# Patient Record
Sex: Male | Born: 1950
Health system: Southern US, Community
[De-identification: ages and names within clinical notes are randomized; demographics above are authoritative.]

## PROBLEM LIST (undated history)

## (undated) DIAGNOSIS — I700 Atherosclerosis of aorta: Secondary | ICD-10-CM

## (undated) DIAGNOSIS — I10 Essential (primary) hypertension: Secondary | ICD-10-CM

## (undated) DIAGNOSIS — N400 Enlarged prostate without lower urinary tract symptoms: Secondary | ICD-10-CM

## (undated) DIAGNOSIS — K635 Polyp of colon: Secondary | ICD-10-CM

## (undated) DIAGNOSIS — E7800 Pure hypercholesterolemia, unspecified: Secondary | ICD-10-CM

## (undated) DIAGNOSIS — I2510 Atherosclerotic heart disease of native coronary artery without angina pectoris: Secondary | ICD-10-CM

## (undated) DIAGNOSIS — M1990 Unspecified osteoarthritis, unspecified site: Secondary | ICD-10-CM

## (undated) HISTORY — PX: VASECTOMY: SHX75

## (undated) HISTORY — DX: Polyp of colon: K63.5

## (undated) HISTORY — DX: Pure hypercholesterolemia, unspecified: E78.00

## (undated) HISTORY — DX: Benign prostatic hyperplasia without lower urinary tract symptoms: N40.0

## (undated) HISTORY — DX: Atherosclerotic heart disease of native coronary artery without angina pectoris: I25.10

## (undated) HISTORY — DX: Atherosclerosis of aorta: I70.0

## (undated) HISTORY — DX: Essential (primary) hypertension: I10

## (undated) HISTORY — DX: Unspecified osteoarthritis, unspecified site: M19.90

## (undated) HISTORY — PX: TONSILLECTOMY: SUR1361

---

## 2016-02-02 ENCOUNTER — Other Ambulatory Visit: Payer: Self-pay | Admitting: Unknown Physician Specialty

## 2016-02-02 DIAGNOSIS — Z1211 Encounter for screening for malignant neoplasm of colon: Secondary | ICD-10-CM

## 2016-02-08 ENCOUNTER — Other Ambulatory Visit: Payer: Self-pay | Admitting: Unknown Physician Specialty

## 2016-02-08 DIAGNOSIS — Z1211 Encounter for screening for malignant neoplasm of colon: Secondary | ICD-10-CM

## 2016-02-21 ENCOUNTER — Ambulatory Visit
Admission: RE | Admit: 2016-02-21 | Discharge: 2016-02-21 | Disposition: A | Discharge status: Home / Self Care | Payer: BLUE CROSS/BLUE SHIELD | Source: Ambulatory Visit | Attending: Unknown Physician Specialty | Admitting: Unknown Physician Specialty

## 2016-02-21 ENCOUNTER — Inpatient Hospital Stay: Admission: RE | Admit: 2016-02-21 | Payer: Self-pay | Source: Ambulatory Visit

## 2016-02-21 DIAGNOSIS — Z1211 Encounter for screening for malignant neoplasm of colon: Secondary | ICD-10-CM

## 2017-12-24 DIAGNOSIS — L570 Actinic keratosis: Secondary | ICD-10-CM | POA: Diagnosis not present

## 2017-12-24 DIAGNOSIS — L820 Inflamed seborrheic keratosis: Secondary | ICD-10-CM | POA: Diagnosis not present

## 2017-12-24 DIAGNOSIS — L918 Other hypertrophic disorders of the skin: Secondary | ICD-10-CM | POA: Diagnosis not present

## 2017-12-24 DIAGNOSIS — D1801 Hemangioma of skin and subcutaneous tissue: Secondary | ICD-10-CM | POA: Diagnosis not present

## 2018-01-17 DIAGNOSIS — I10 Essential (primary) hypertension: Secondary | ICD-10-CM | POA: Diagnosis not present

## 2018-01-17 DIAGNOSIS — Z23 Encounter for immunization: Secondary | ICD-10-CM | POA: Diagnosis not present

## 2018-09-30 DIAGNOSIS — N400 Enlarged prostate without lower urinary tract symptoms: Secondary | ICD-10-CM | POA: Diagnosis not present

## 2018-09-30 DIAGNOSIS — Z0000 Encounter for general adult medical examination without abnormal findings: Secondary | ICD-10-CM | POA: Diagnosis not present

## 2018-09-30 DIAGNOSIS — M8949 Other hypertrophic osteoarthropathy, multiple sites: Secondary | ICD-10-CM | POA: Diagnosis not present

## 2018-09-30 DIAGNOSIS — I10 Essential (primary) hypertension: Secondary | ICD-10-CM | POA: Diagnosis not present

## 2018-09-30 DIAGNOSIS — Z1389 Encounter for screening for other disorder: Secondary | ICD-10-CM | POA: Diagnosis not present

## 2018-09-30 DIAGNOSIS — Z6835 Body mass index (BMI) 35.0-35.9, adult: Secondary | ICD-10-CM | POA: Diagnosis not present

## 2019-04-01 DIAGNOSIS — I10 Essential (primary) hypertension: Secondary | ICD-10-CM | POA: Diagnosis not present

## 2019-06-09 DIAGNOSIS — R0789 Other chest pain: Secondary | ICD-10-CM | POA: Diagnosis not present

## 2019-06-09 DIAGNOSIS — M25512 Pain in left shoulder: Secondary | ICD-10-CM | POA: Diagnosis not present

## 2019-06-10 DIAGNOSIS — R0789 Other chest pain: Secondary | ICD-10-CM | POA: Diagnosis not present

## 2019-06-10 DIAGNOSIS — S299XXA Unspecified injury of thorax, initial encounter: Secondary | ICD-10-CM | POA: Diagnosis not present

## 2019-06-10 DIAGNOSIS — M25512 Pain in left shoulder: Secondary | ICD-10-CM | POA: Diagnosis not present

## 2019-12-29 DIAGNOSIS — L820 Inflamed seborrheic keratosis: Secondary | ICD-10-CM | POA: Diagnosis not present

## 2019-12-29 DIAGNOSIS — L739 Follicular disorder, unspecified: Secondary | ICD-10-CM | POA: Diagnosis not present

## 2020-03-18 DIAGNOSIS — J101 Influenza due to other identified influenza virus with other respiratory manifestations: Secondary | ICD-10-CM | POA: Diagnosis not present

## 2020-06-03 DIAGNOSIS — J329 Chronic sinusitis, unspecified: Secondary | ICD-10-CM | POA: Diagnosis not present

## 2020-07-21 DIAGNOSIS — Z0000 Encounter for general adult medical examination without abnormal findings: Secondary | ICD-10-CM | POA: Diagnosis not present

## 2020-07-21 DIAGNOSIS — Z1331 Encounter for screening for depression: Secondary | ICD-10-CM | POA: Diagnosis not present

## 2020-07-21 DIAGNOSIS — I10 Essential (primary) hypertension: Secondary | ICD-10-CM | POA: Diagnosis not present

## 2020-07-21 DIAGNOSIS — M159 Polyosteoarthritis, unspecified: Secondary | ICD-10-CM | POA: Diagnosis not present

## 2020-07-21 DIAGNOSIS — Z6837 Body mass index (BMI) 37.0-37.9, adult: Secondary | ICD-10-CM | POA: Diagnosis not present

## 2020-07-21 DIAGNOSIS — Z1339 Encounter for screening examination for other mental health and behavioral disorders: Secondary | ICD-10-CM | POA: Diagnosis not present

## 2020-07-21 DIAGNOSIS — N400 Enlarged prostate without lower urinary tract symptoms: Secondary | ICD-10-CM | POA: Diagnosis not present

## 2020-07-26 ENCOUNTER — Other Ambulatory Visit: Payer: Self-pay | Admitting: Internal Medicine

## 2020-07-26 DIAGNOSIS — Z1211 Encounter for screening for malignant neoplasm of colon: Secondary | ICD-10-CM

## 2020-08-15 ENCOUNTER — Ambulatory Visit
Admission: RE | Admit: 2020-08-15 | Discharge: 2020-08-15 | Disposition: A | Discharge status: Home / Self Care | Payer: PPO | Source: Ambulatory Visit | Attending: Internal Medicine | Admitting: Internal Medicine

## 2020-08-15 DIAGNOSIS — Z1211 Encounter for screening for malignant neoplasm of colon: Secondary | ICD-10-CM

## 2020-08-15 DIAGNOSIS — K5730 Diverticulosis of large intestine without perforation or abscess without bleeding: Secondary | ICD-10-CM | POA: Diagnosis not present

## 2020-08-23 DIAGNOSIS — Z79899 Other long term (current) drug therapy: Secondary | ICD-10-CM | POA: Diagnosis not present

## 2020-08-23 DIAGNOSIS — Z01818 Encounter for other preprocedural examination: Secondary | ICD-10-CM | POA: Diagnosis not present

## 2020-08-23 DIAGNOSIS — M79609 Pain in unspecified limb: Secondary | ICD-10-CM | POA: Diagnosis not present

## 2020-08-23 DIAGNOSIS — M1711 Unilateral primary osteoarthritis, right knee: Secondary | ICD-10-CM | POA: Diagnosis not present

## 2020-08-23 DIAGNOSIS — E559 Vitamin D deficiency, unspecified: Secondary | ICD-10-CM | POA: Diagnosis not present

## 2020-09-02 DIAGNOSIS — I10 Essential (primary) hypertension: Secondary | ICD-10-CM | POA: Diagnosis not present

## 2020-09-02 DIAGNOSIS — M1711 Unilateral primary osteoarthritis, right knee: Secondary | ICD-10-CM | POA: Diagnosis not present

## 2020-09-22 DIAGNOSIS — Z01818 Encounter for other preprocedural examination: Secondary | ICD-10-CM | POA: Diagnosis not present

## 2020-09-22 DIAGNOSIS — M1711 Unilateral primary osteoarthritis, right knee: Secondary | ICD-10-CM | POA: Diagnosis not present

## 2020-09-28 DIAGNOSIS — Z9889 Other specified postprocedural states: Secondary | ICD-10-CM | POA: Diagnosis not present

## 2020-09-28 DIAGNOSIS — I10 Essential (primary) hypertension: Secondary | ICD-10-CM | POA: Diagnosis not present

## 2020-09-28 DIAGNOSIS — Z7982 Long term (current) use of aspirin: Secondary | ICD-10-CM | POA: Diagnosis not present

## 2020-09-28 DIAGNOSIS — G8918 Other acute postprocedural pain: Secondary | ICD-10-CM | POA: Diagnosis not present

## 2020-09-28 DIAGNOSIS — Z79899 Other long term (current) drug therapy: Secondary | ICD-10-CM | POA: Diagnosis not present

## 2020-09-28 DIAGNOSIS — Z471 Aftercare following joint replacement surgery: Secondary | ICD-10-CM | POA: Diagnosis not present

## 2020-09-28 DIAGNOSIS — M1711 Unilateral primary osteoarthritis, right knee: Secondary | ICD-10-CM | POA: Diagnosis not present

## 2020-09-28 DIAGNOSIS — Z96651 Presence of right artificial knee joint: Secondary | ICD-10-CM | POA: Diagnosis not present

## 2020-09-30 DIAGNOSIS — Z471 Aftercare following joint replacement surgery: Secondary | ICD-10-CM | POA: Diagnosis not present

## 2020-09-30 DIAGNOSIS — K5790 Diverticulosis of intestine, part unspecified, without perforation or abscess without bleeding: Secondary | ICD-10-CM | POA: Diagnosis not present

## 2020-09-30 DIAGNOSIS — I10 Essential (primary) hypertension: Secondary | ICD-10-CM | POA: Diagnosis not present

## 2020-09-30 DIAGNOSIS — Z96651 Presence of right artificial knee joint: Secondary | ICD-10-CM | POA: Diagnosis not present

## 2020-09-30 DIAGNOSIS — Z86010 Personal history of colonic polyps: Secondary | ICD-10-CM | POA: Diagnosis not present

## 2020-10-11 DIAGNOSIS — Z96651 Presence of right artificial knee joint: Secondary | ICD-10-CM | POA: Diagnosis not present

## 2020-10-14 DIAGNOSIS — M25561 Pain in right knee: Secondary | ICD-10-CM | POA: Diagnosis not present

## 2020-10-14 DIAGNOSIS — M6281 Muscle weakness (generalized): Secondary | ICD-10-CM | POA: Diagnosis not present

## 2020-10-14 DIAGNOSIS — M25661 Stiffness of right knee, not elsewhere classified: Secondary | ICD-10-CM | POA: Diagnosis not present

## 2020-10-19 DIAGNOSIS — M25661 Stiffness of right knee, not elsewhere classified: Secondary | ICD-10-CM | POA: Diagnosis not present

## 2020-10-19 DIAGNOSIS — M25561 Pain in right knee: Secondary | ICD-10-CM | POA: Diagnosis not present

## 2020-10-19 DIAGNOSIS — M6281 Muscle weakness (generalized): Secondary | ICD-10-CM | POA: Diagnosis not present

## 2020-10-21 DIAGNOSIS — M6281 Muscle weakness (generalized): Secondary | ICD-10-CM | POA: Diagnosis not present

## 2020-10-21 DIAGNOSIS — M25661 Stiffness of right knee, not elsewhere classified: Secondary | ICD-10-CM | POA: Diagnosis not present

## 2020-10-21 DIAGNOSIS — M25561 Pain in right knee: Secondary | ICD-10-CM | POA: Diagnosis not present

## 2020-10-24 DIAGNOSIS — M170 Bilateral primary osteoarthritis of knee: Secondary | ICD-10-CM | POA: Diagnosis not present

## 2020-10-24 DIAGNOSIS — I10 Essential (primary) hypertension: Secondary | ICD-10-CM | POA: Diagnosis not present

## 2020-10-25 DIAGNOSIS — M6281 Muscle weakness (generalized): Secondary | ICD-10-CM | POA: Diagnosis not present

## 2020-10-25 DIAGNOSIS — M25561 Pain in right knee: Secondary | ICD-10-CM | POA: Diagnosis not present

## 2020-10-25 DIAGNOSIS — M25661 Stiffness of right knee, not elsewhere classified: Secondary | ICD-10-CM | POA: Diagnosis not present

## 2020-10-28 DIAGNOSIS — M25561 Pain in right knee: Secondary | ICD-10-CM | POA: Diagnosis not present

## 2020-10-28 DIAGNOSIS — M6281 Muscle weakness (generalized): Secondary | ICD-10-CM | POA: Diagnosis not present

## 2020-10-28 DIAGNOSIS — M25661 Stiffness of right knee, not elsewhere classified: Secondary | ICD-10-CM | POA: Diagnosis not present

## 2020-11-01 DIAGNOSIS — M25561 Pain in right knee: Secondary | ICD-10-CM | POA: Diagnosis not present

## 2020-11-01 DIAGNOSIS — M6281 Muscle weakness (generalized): Secondary | ICD-10-CM | POA: Diagnosis not present

## 2020-11-01 DIAGNOSIS — M25661 Stiffness of right knee, not elsewhere classified: Secondary | ICD-10-CM | POA: Diagnosis not present

## 2020-11-04 DIAGNOSIS — M6281 Muscle weakness (generalized): Secondary | ICD-10-CM | POA: Diagnosis not present

## 2020-11-04 DIAGNOSIS — M25661 Stiffness of right knee, not elsewhere classified: Secondary | ICD-10-CM | POA: Diagnosis not present

## 2020-11-04 DIAGNOSIS — M25561 Pain in right knee: Secondary | ICD-10-CM | POA: Diagnosis not present

## 2020-11-08 DIAGNOSIS — M25561 Pain in right knee: Secondary | ICD-10-CM | POA: Diagnosis not present

## 2020-11-08 DIAGNOSIS — M25661 Stiffness of right knee, not elsewhere classified: Secondary | ICD-10-CM | POA: Diagnosis not present

## 2020-11-08 DIAGNOSIS — M6281 Muscle weakness (generalized): Secondary | ICD-10-CM | POA: Diagnosis not present

## 2020-11-10 DIAGNOSIS — Z96651 Presence of right artificial knee joint: Secondary | ICD-10-CM | POA: Diagnosis not present

## 2020-11-10 DIAGNOSIS — M1711 Unilateral primary osteoarthritis, right knee: Secondary | ICD-10-CM | POA: Diagnosis not present

## 2020-11-11 DIAGNOSIS — M25661 Stiffness of right knee, not elsewhere classified: Secondary | ICD-10-CM | POA: Diagnosis not present

## 2020-11-11 DIAGNOSIS — M6281 Muscle weakness (generalized): Secondary | ICD-10-CM | POA: Diagnosis not present

## 2020-11-11 DIAGNOSIS — M25561 Pain in right knee: Secondary | ICD-10-CM | POA: Diagnosis not present

## 2020-11-14 DIAGNOSIS — M6281 Muscle weakness (generalized): Secondary | ICD-10-CM | POA: Diagnosis not present

## 2020-11-14 DIAGNOSIS — M25561 Pain in right knee: Secondary | ICD-10-CM | POA: Diagnosis not present

## 2020-11-14 DIAGNOSIS — M25661 Stiffness of right knee, not elsewhere classified: Secondary | ICD-10-CM | POA: Diagnosis not present

## 2020-11-16 DIAGNOSIS — M25561 Pain in right knee: Secondary | ICD-10-CM | POA: Diagnosis not present

## 2020-11-16 DIAGNOSIS — M6281 Muscle weakness (generalized): Secondary | ICD-10-CM | POA: Diagnosis not present

## 2020-11-16 DIAGNOSIS — M25661 Stiffness of right knee, not elsewhere classified: Secondary | ICD-10-CM | POA: Diagnosis not present

## 2020-11-22 DIAGNOSIS — M25661 Stiffness of right knee, not elsewhere classified: Secondary | ICD-10-CM | POA: Diagnosis not present

## 2020-11-22 DIAGNOSIS — M25561 Pain in right knee: Secondary | ICD-10-CM | POA: Diagnosis not present

## 2020-11-22 DIAGNOSIS — M6281 Muscle weakness (generalized): Secondary | ICD-10-CM | POA: Diagnosis not present

## 2020-11-24 DIAGNOSIS — M25561 Pain in right knee: Secondary | ICD-10-CM | POA: Diagnosis not present

## 2020-11-24 DIAGNOSIS — M25661 Stiffness of right knee, not elsewhere classified: Secondary | ICD-10-CM | POA: Diagnosis not present

## 2020-11-24 DIAGNOSIS — M6281 Muscle weakness (generalized): Secondary | ICD-10-CM | POA: Diagnosis not present

## 2020-11-29 DIAGNOSIS — M25661 Stiffness of right knee, not elsewhere classified: Secondary | ICD-10-CM | POA: Diagnosis not present

## 2020-11-29 DIAGNOSIS — M25561 Pain in right knee: Secondary | ICD-10-CM | POA: Diagnosis not present

## 2020-11-29 DIAGNOSIS — M6281 Muscle weakness (generalized): Secondary | ICD-10-CM | POA: Diagnosis not present

## 2020-12-01 DIAGNOSIS — M25561 Pain in right knee: Secondary | ICD-10-CM | POA: Diagnosis not present

## 2020-12-01 DIAGNOSIS — M25661 Stiffness of right knee, not elsewhere classified: Secondary | ICD-10-CM | POA: Diagnosis not present

## 2020-12-01 DIAGNOSIS — M6281 Muscle weakness (generalized): Secondary | ICD-10-CM | POA: Diagnosis not present

## 2020-12-06 DIAGNOSIS — M25561 Pain in right knee: Secondary | ICD-10-CM | POA: Diagnosis not present

## 2020-12-06 DIAGNOSIS — M6281 Muscle weakness (generalized): Secondary | ICD-10-CM | POA: Diagnosis not present

## 2020-12-06 DIAGNOSIS — M25661 Stiffness of right knee, not elsewhere classified: Secondary | ICD-10-CM | POA: Diagnosis not present

## 2020-12-08 DIAGNOSIS — M6281 Muscle weakness (generalized): Secondary | ICD-10-CM | POA: Diagnosis not present

## 2020-12-08 DIAGNOSIS — M25661 Stiffness of right knee, not elsewhere classified: Secondary | ICD-10-CM | POA: Diagnosis not present

## 2020-12-08 DIAGNOSIS — M25561 Pain in right knee: Secondary | ICD-10-CM | POA: Diagnosis not present

## 2020-12-13 DIAGNOSIS — M6281 Muscle weakness (generalized): Secondary | ICD-10-CM | POA: Diagnosis not present

## 2020-12-13 DIAGNOSIS — M25661 Stiffness of right knee, not elsewhere classified: Secondary | ICD-10-CM | POA: Diagnosis not present

## 2020-12-13 DIAGNOSIS — M25561 Pain in right knee: Secondary | ICD-10-CM | POA: Diagnosis not present

## 2020-12-15 DIAGNOSIS — M6281 Muscle weakness (generalized): Secondary | ICD-10-CM | POA: Diagnosis not present

## 2020-12-15 DIAGNOSIS — M25561 Pain in right knee: Secondary | ICD-10-CM | POA: Diagnosis not present

## 2020-12-15 DIAGNOSIS — M25661 Stiffness of right knee, not elsewhere classified: Secondary | ICD-10-CM | POA: Diagnosis not present

## 2020-12-28 DIAGNOSIS — H2513 Age-related nuclear cataract, bilateral: Secondary | ICD-10-CM | POA: Diagnosis not present

## 2021-02-27 DIAGNOSIS — I10 Essential (primary) hypertension: Secondary | ICD-10-CM | POA: Diagnosis not present

## 2021-02-27 DIAGNOSIS — Z23 Encounter for immunization: Secondary | ICD-10-CM | POA: Diagnosis not present

## 2021-07-24 DIAGNOSIS — I10 Essential (primary) hypertension: Secondary | ICD-10-CM | POA: Diagnosis not present

## 2021-07-24 DIAGNOSIS — M159 Polyosteoarthritis, unspecified: Secondary | ICD-10-CM | POA: Diagnosis not present

## 2021-07-24 DIAGNOSIS — N400 Enlarged prostate without lower urinary tract symptoms: Secondary | ICD-10-CM | POA: Diagnosis not present

## 2021-07-24 DIAGNOSIS — Z1331 Encounter for screening for depression: Secondary | ICD-10-CM | POA: Diagnosis not present

## 2021-07-24 DIAGNOSIS — Z0000 Encounter for general adult medical examination without abnormal findings: Secondary | ICD-10-CM | POA: Diagnosis not present

## 2021-07-24 DIAGNOSIS — Z6838 Body mass index (BMI) 38.0-38.9, adult: Secondary | ICD-10-CM | POA: Diagnosis not present

## 2021-07-24 DIAGNOSIS — I700 Atherosclerosis of aorta: Secondary | ICD-10-CM | POA: Diagnosis not present

## 2021-07-24 DIAGNOSIS — Z125 Encounter for screening for malignant neoplasm of prostate: Secondary | ICD-10-CM | POA: Diagnosis not present

## 2021-10-09 DIAGNOSIS — L821 Other seborrheic keratosis: Secondary | ICD-10-CM | POA: Diagnosis not present

## 2021-10-09 DIAGNOSIS — D485 Neoplasm of uncertain behavior of skin: Secondary | ICD-10-CM | POA: Diagnosis not present

## 2021-10-09 DIAGNOSIS — L820 Inflamed seborrheic keratosis: Secondary | ICD-10-CM | POA: Diagnosis not present

## 2021-10-09 DIAGNOSIS — D2239 Melanocytic nevi of other parts of face: Secondary | ICD-10-CM | POA: Diagnosis not present

## 2021-10-09 DIAGNOSIS — D225 Melanocytic nevi of trunk: Secondary | ICD-10-CM | POA: Diagnosis not present

## 2021-10-09 DIAGNOSIS — L570 Actinic keratosis: Secondary | ICD-10-CM | POA: Diagnosis not present

## 2021-10-30 DIAGNOSIS — I700 Atherosclerosis of aorta: Secondary | ICD-10-CM | POA: Diagnosis not present

## 2021-11-06 DIAGNOSIS — C4441 Basal cell carcinoma of skin of scalp and neck: Secondary | ICD-10-CM | POA: Diagnosis not present

## 2021-11-23 DIAGNOSIS — I10 Essential (primary) hypertension: Secondary | ICD-10-CM | POA: Diagnosis not present

## 2022-01-20 DIAGNOSIS — L820 Inflamed seborrheic keratosis: Secondary | ICD-10-CM | POA: Diagnosis not present

## 2022-01-20 DIAGNOSIS — C4441 Basal cell carcinoma of skin of scalp and neck: Secondary | ICD-10-CM | POA: Diagnosis not present

## 2022-02-17 DIAGNOSIS — D2239 Melanocytic nevi of other parts of face: Secondary | ICD-10-CM | POA: Diagnosis not present

## 2022-02-17 DIAGNOSIS — D225 Melanocytic nevi of trunk: Secondary | ICD-10-CM | POA: Diagnosis not present

## 2022-02-17 DIAGNOSIS — L570 Actinic keratosis: Secondary | ICD-10-CM | POA: Diagnosis not present

## 2022-02-17 DIAGNOSIS — L820 Inflamed seborrheic keratosis: Secondary | ICD-10-CM | POA: Diagnosis not present

## 2022-03-23 ENCOUNTER — Ambulatory Visit (INDEPENDENT_AMBULATORY_CARE_PROVIDER_SITE_OTHER): Payer: PPO | Admitting: Internal Medicine

## 2022-03-23 ENCOUNTER — Encounter: Payer: Self-pay | Admitting: Internal Medicine

## 2022-03-23 VITALS — BP 142/82 | HR 68 | Temp 97.9°F | Resp 16 | Ht 70.0 in | Wt 269.1 lb

## 2022-03-23 DIAGNOSIS — I10 Essential (primary) hypertension: Secondary | ICD-10-CM | POA: Diagnosis not present

## 2022-03-23 DIAGNOSIS — R0789 Other chest pain: Secondary | ICD-10-CM | POA: Insufficient documentation

## 2022-03-23 MED ORDER — AMLODIPINE BESYLATE 10 MG PO TABS: 10.0000 mg | ORAL_TABLET | Freq: Every day | ORAL | 2 refills | Status: DC

## 2022-03-23 MED ORDER — NITROGLYCERIN 0.3 MG SL SUBL: 0.3000 mg | SUBLINGUAL_TABLET | SUBLINGUAL | 12 refills | Status: DC | PRN

## 2022-03-23 NOTE — Progress Notes (Signed)
Office Visit  Subjective   Patient ID: Justin Bailey   DOB: Nov 06, 1950   Age: 71 y.o.   MRN: [id number]   Chief Complaint Chief Complaint  Patient presents with   Follow-up    hypertension     History of Present Illness The patient is a 71 year old Caucasian/White male who presents for a follow-up evaluation of hypertension.  On his last visit, his BP was not controlled and we added amlodipine to his regimen.  Since his last visit, there has been no problems. The patient has not been checking his blood pressure at home. The patient's current medications include: lisinopril 40 mg daily and amlodipine '5mg'$  daily. The patient has been tolerating his medications well. The patient denies any headache, visual changes, dizziness, lightheadness, chest pain, shortness of breath, orthopnea, weakness/numbness, and edema.  He reports there have been no other symptoms noted.    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  This is a tightness that will last for 10-15 min and then resolve.  It started 3-4 months ago occurs several times during the week.  There is no associated DOE, diaphoresis, n/v, palpitations or other problems.  He does take a ASA '81mg'$  daily.  The patient does not smoke.      Past Medical History Past Medical History:  Diagnosis Date   BPH (benign prostatic hyperplasia)    Colon polyp    Hypertension      Allergies No Known Allergies   Review of Systems Review of Systems  Constitutional:  Negative for chills and fever.  Eyes:  Negative for blurred vision and double vision.  Respiratory:  Negative for cough and shortness of breath.   Cardiovascular:  Positive for chest pain. Negative for palpitations and leg swelling.  Gastrointestinal:  Negative for constipation, diarrhea, nausea and vomiting.  Musculoskeletal:  Negative for myalgias.  Neurological:  Negative for dizziness, weakness and  headaches.       Objective:    Vitals BP (!) 142/82   Pulse 68   Temp 97.9 F (36.6 C)   Resp 16   Ht '5\' 10"'$  (1.778 m)   Wt 269 lb 0.8 oz (122 kg)   SpO2 98%   BMI 38.60 kg/m    Physical Examination Physical Exam Constitutional:      Appearance: Normal appearance. He is not ill-appearing.  Cardiovascular:     Rate and Rhythm: Normal rate and regular rhythm.     Pulses: Normal pulses.     Heart sounds: No murmur heard.    No friction rub. No gallop.  Pulmonary:     Effort: Pulmonary effort is normal. No respiratory distress.     Breath sounds: No wheezing, rhonchi or rales.  Abdominal:     General: Abdomen is flat. Bowel sounds are normal. There is no distension.     Palpations: Abdomen is soft.     Tenderness: There is no abdominal tenderness.  Musculoskeletal:     Right lower leg: No edema.     Left lower leg: No edema.  Skin:    General: Skin is warm and dry.     Findings: No rash.  Neurological:     Mental Status: He is alert.        Assessment & Plan:   Essential hypertension His BP is still not optimal.  I am going to increase his amlodipine from '5mg'$  to '10mg'$ .  Risks of edema discussed.  Continue lisinopril and watch intake of sodium.  Chest tightness He is having some exertional chest tightness.  We did an EKG on him today and this showed NSR.  I am going to give him some nitro to see if this helps and get him set up to see cardiology.    No follow-ups on file.   Townsend Roger, MD

## 2022-03-23 NOTE — Assessment & Plan Note (Signed)
He is having some exertional chest tightness.  We did an EKG on him today and this showed NSR.  I am going to give him some nitro to see if this helps and get him set up to see cardiology.

## 2022-03-23 NOTE — Assessment & Plan Note (Signed)
His BP is still not optimal.  I am going to increase his amlodipine from '5mg'$  to '10mg'$ .  Risks of edema discussed.  Continue lisinopril and watch intake of sodium.

## 2022-04-04 ENCOUNTER — Other Ambulatory Visit: Payer: Self-pay

## 2022-04-04 MED ORDER — LISINOPRIL 40 MG PO TABS: 40.0000 mg | ORAL_TABLET | Freq: Every day | ORAL | 1 refills | Status: DC

## 2022-04-11 ENCOUNTER — Other Ambulatory Visit: Payer: Self-pay

## 2022-04-11 MED ORDER — LOVASTATIN 10 MG PO TABS: 10.0000 mg | ORAL_TABLET | Freq: Every day | ORAL | 3 refills | Status: DC

## 2022-04-17 DIAGNOSIS — K635 Polyp of colon: Secondary | ICD-10-CM | POA: Insufficient documentation

## 2022-04-17 DIAGNOSIS — N400 Enlarged prostate without lower urinary tract symptoms: Secondary | ICD-10-CM | POA: Insufficient documentation

## 2022-04-17 NOTE — Progress Notes (Unsigned)
Cardiology Office Note:    Date:  04/19/2022   ID:  Justin Bailey, DOB February 16, 1951, MRN [id number]  PCP:  Townsend Roger, MD  Cardiologist:  Shirlee More, MD   Referring MD: Townsend Roger, MD  ASSESSMENT:    1. Exertional chest pain   2. Precordial pain   3. Essential hypertension   4. Mixed hyperlipidemia    PLAN:    In order of problems listed above:  He has multiple cardiovascular risk and is having symptoms of occult probable angina most consistent with walk-through angina although the symptoms have resolved in the last few weeks.  He has no evidence of acute coronary syndrome.  I asked him to add aspirin to his antihypertensive and statin and has a prescription for nitroglycerin he can take as needed.  He agrees to undergo an ischemia evaluation we discussed options and he opts for CTA cardiac and prefers [facility].  This will give [place] a calcium score to judge whether he needs ongoing lipid-lowering therapy and intensity as well as the presence or absence of CAD and if he has high risk anatomy such as left main or severe three-vessel disease of proximal LAD stenosis to consider angiography and intervention.  He will continue his current activities Controlled continue his current combination of ACE inhibitor calcium channel blocker Continue statin at this calcium score of 0 on cardiac CTA is normal treatment would be optional  Next appointment 3 months   Medication Adjustments/Labs and Tests Ordered: Current medicines are reviewed at length with the patient today.  Concerns regarding medicines are outlined above.  Orders Placed This Encounter  Procedures   CT CORONARY MORPH W/CTA COR W/SCORE W/CA W/CM &/OR WO/CM   Basic Metabolic Panel (BMET)   EKG 12-Lead   Meds ordered this encounter  Medications   aspirin EC 81 MG tablet    Sig: Take 1 tablet (81 mg total) by mouth daily. Swallow whole.    Dispense:  90 tablet    Refill:  3   metoprolol tartrate (LOPRESSOR) 100  MG tablet    Sig: Take 1 tablet (100 mg total) by mouth once for 1 dose. Please take 2 hours before CT    Dispense:  1 tablet    Refill:  0     Chief Complaint  Patient presents with   Chest Pain    History of Present Illness:    Justin Bailey is a 72 y.o. male with a history of hypertension who is being seen today for the evaluation of exertional chest pain at the request of Townsend Roger, MD.  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  In the last 2 weeks the symptoms have resolved and not recurred He had no chest wall trauma no respiratory symptoms no associated GI symptoms. He has never experienced exertional chest  discomfort before Caused him to be concerned and sought attention with his primary care physician He is not convinced that he has a problem. Past Medical History:  Diagnosis Date   BPH (benign prostatic hyperplasia)    Colon polyp    Hypertension     Past Surgical History:  Procedure Laterality Date   TONSILLECTOMY     VASECTOMY      Current Medications: Current Meds  Medication Sig   amLODipine (NORVASC) 10 MG tablet Take 1 tablet (10 mg total) by mouth daily.   aspirin EC 81 MG tablet Take 1 tablet (81 mg total) by mouth daily. Swallow whole.   Docusate Calcium (STOOL SOFTENER PO)  Take 3 tablets by mouth daily.   lisinopril (ZESTRIL) 40 MG tablet Take 1 tablet (40 mg total) by mouth daily.   lovastatin (MEVACOR) 10 MG tablet Take 1 tablet (10 mg total) by mouth at bedtime.   metoprolol tartrate (LOPRESSOR) 100 MG tablet Take 1 tablet (100 mg total) by mouth once for 1 dose. Please take 2 hours before CT   Multiple Vitamins-Minerals (CENTRUM SILVER 50+MEN PO) Take 1 tablet by mouth daily.   nitroGLYCERIN (NITROSTAT) 0.3 MG SL tablet Place 1 tablet (0.3 mg total) under the tongue every 5 (five) minutes as needed for chest pain (if pain not gone after 3 doses, call 911).   [DISCONTINUED] aspirin EC 81 MG tablet Take 81 mg by mouth daily. Swallow whole.     Allergies:   Patient has no known allergies.   Social History   Socioeconomic History   Marital status: Single    Spouse name: Not on file   Number of children: Not on file   Years of education: Not on file   Highest education level: Not on file  Occupational History   Not on file  Tobacco Use   Smoking status: Never   Smokeless tobacco: Former    Types: Snuff    Quit date: 03/2020  Substance and Sexual Activity   Alcohol use: Never   Drug use: Never   Sexual activity: Not on file  Other Topics Concern   Not on file  Social History Narrative   Not on file   Social Determinants of Health   Financial Resource Strain: Not on file  Food Insecurity: Not on file  Transportation Needs: Not on file  Physical Activity: Not on file  Stress: Not on file  Social Connections: Not on file     Family History: The patient's family history includes Cancer in his mother; Diabetes in his mother; Hypertension in his mother; Thyroid disease in his mother.  ROS:   ROS Please see the history of present illness.     All other systems reviewed and are negative.  EKGs/Labs/Other Studies Reviewed:    The following studies were reviewed today:   EKG:  EKG is  ordered today.  The ekg ordered today is personally  reviewed and demonstrates sinus rhythm first-degree AV block otherwise normal EKG  Recent Labs: 10/24/2021: Cholesterol 129 LDL 77 triglycerides 76 HDL 37 A1c 5.6% creatinine 0.83 potassium 5.2 hemoglobin 15.2 all normal Physical Exam:    VS:  BP 138/84 (BP Location: Right Arm, Patient Position: Sitting)   Pulse 71   Ht '5\' 10"'$  (1.778 m)   Wt 264 lb (119.7 kg)   SpO2 94%   BMI 37.88 kg/m     Wt Readings from Last 3 Encounters:  04/19/22 264 lb (119.7 kg)  03/23/22 269 lb 0.8 oz (122  kg)     GEN: Overweight/obese well nourished, well developed in no acute distress HEENT: Normal NECK: No JVD; No carotid bruits LYMPHATICS: No lymphadenopathy CARDIAC: No chest wall tenderness RRR, no murmurs, rubs, gallops RESPIRATORY:  Clear to auscultation without rales, wheezing or rhonchi  ABDOMEN: Soft, non-tender, non-distended MUSCULOSKELETAL:  No edema; No deformity  SKIN: Warm and dry he has horizontal earlobe creases NEUROLOGIC:  Alert and oriented x 3 PSYCHIATRIC:  Normal affect     Signed, Shirlee More, MD  04/19/2022 12:07 PM    [place]

## 2022-04-19 ENCOUNTER — Encounter: Payer: Self-pay | Admitting: Cardiology

## 2022-04-19 ENCOUNTER — Ambulatory Visit: Payer: PPO | Attending: Cardiology | Admitting: Cardiology

## 2022-04-19 VITALS — BP 138/84 | HR 71 | Ht 70.0 in | Wt 264.0 lb

## 2022-04-19 DIAGNOSIS — E782 Mixed hyperlipidemia: Secondary | ICD-10-CM

## 2022-04-19 DIAGNOSIS — I10 Essential (primary) hypertension: Secondary | ICD-10-CM

## 2022-04-19 DIAGNOSIS — R079 Chest pain, unspecified: Secondary | ICD-10-CM

## 2022-04-19 DIAGNOSIS — R072 Precordial pain: Secondary | ICD-10-CM | POA: Diagnosis not present

## 2022-04-19 MED ORDER — ASPIRIN 81 MG PO TBEC: 81.0000 mg | DELAYED_RELEASE_TABLET | Freq: Every day | ORAL | 3 refills | Status: AC

## 2022-04-19 MED ORDER — METOPROLOL TARTRATE 100 MG PO TABS: 100.0000 mg | ORAL_TABLET | Freq: Once | ORAL | 0 refills | Status: DC

## 2022-04-19 NOTE — Patient Instructions (Signed)
Medication Instructions:  Your physician has recommended you make the following change in your medication:   START: Aspirin 81 mg daily  *If you need a refill on your cardiac medications before your next appointment, please call your pharmacy*   Lab Work: Your physician recommends that you return for lab work in:   Labs 2 hours before CT: BMP  If you have labs (blood work) drawn today and your tests are completely normal, you will receive your results only by: [place] (if you have MyChart) OR A paper copy in the mail If you have any lab test that is abnormal or we need to change your treatment, we will call you to review the results.   Testing/Procedures:   Your cardiac CT will be scheduled at one of the below locations:   [facility] [address] [place], [place] [address] (336) [place] [address] [place], [place] [address] [phone]  [facility] [place], [place] [address] [phone]  If scheduled at [facility], please arrive at the [facility] and Children's Entrance (Entrance C2) of [facility] 30 minutes prior to test start time. You can use the FREE valet parking offered at entrance C (encouraged to control the heart rate for the test)  Proceed to the [facility] Radiology Department (first floor) to check-in and test prep.  All radiology patients and guests should use entrance C2 at [facility], accessed from [facility], even though the hospital's physical address listed is [address]    If scheduled at [facility] or [facility], please arrive 15 mins early for check-in and test prep.   Please follow these instructions carefully (unless otherwise directed):  On the Night Before the Test: Be sure to Drink plenty of water. Do  not consume any caffeinated/decaffeinated beverages or chocolate 12 hours prior to your test. Do not take any antihistamines 12 hours prior to your test.  On the Day of the Test: Drink plenty of water until 1 hour prior to the test. Do not eat any food 1 hour prior to test. You may take your regular medications prior to the test.  Take metoprolol (Lopressor) two hours prior to test.      After the Test: Drink plenty of water. After receiving IV contrast, you may experience a mild flushed feeling. This is normal. On occasion, you may experience a mild rash up to 24 hours after the test. This is not dangerous. If this occurs, you can take Benadryl 25 mg and increase your fluid intake. If you experience trouble breathing, this can be serious. If it is severe call 911 IMMEDIATELY. If it is mild, please call our office.  We will call to schedule your test 2-4 weeks out understanding that some insurance companies will need an authorization prior to the service being performed.   For non-scheduling related questions, please contact the cardiac imaging nurse navigator should you have any questions/concerns: Marchia Bond, Cardiac Imaging Nurse Navigator Gordy Clement, Cardiac Imaging Nurse Navigator [facility] Direct Office Dial: [phone]   For scheduling needs, including cancellations and rescheduling, please call [place], [phone].    Follow-Up: At [facility], you and your health needs are our priority.  As part of our continuing mission to provide you with exceptional heart care, we have created designated Provider  Care Teams.  These Care Teams include your primary Cardiologist (physician) and Advanced Practice Providers (APPs -  Physician Assistants and Nurse Practitioners) who all work together to provide you with the care you need, when you need it.  We recommend signing up for the patient portal called "MyChart".  Sign up information is  provided on this After Visit Summary.  MyChart is used to connect with patients for Virtual Visits (Telemedicine).  Patients are able to view lab/test results, encounter notes, upcoming appointments, etc.  Non-urgent messages can be sent to your provider as well.   To learn more about what you can do with MyChart, go to [email or web address].    Your next appointment:   3 month(s)  The format for your next appointment:   In Person  Provider:   Shirlee More, MD    Other Instructions None  Important Information About Sugar      \

## 2022-04-20 ENCOUNTER — Other Ambulatory Visit: Payer: Self-pay | Admitting: Cardiology

## 2022-04-27 DIAGNOSIS — I10 Essential (primary) hypertension: Secondary | ICD-10-CM | POA: Diagnosis not present

## 2022-04-27 DIAGNOSIS — R072 Precordial pain: Secondary | ICD-10-CM | POA: Diagnosis not present

## 2022-04-27 DIAGNOSIS — R079 Chest pain, unspecified: Secondary | ICD-10-CM | POA: Diagnosis not present

## 2022-04-27 LAB — BASIC METABOLIC PANEL
BUN/Creatinine Ratio: 19 (ref 10–24)
BUN: 16 mg/dL (ref 8–27)
CO2: 23 mmol/L (ref 20–29)
Calcium: 9.6 mg/dL (ref 8.6–10.2)
Chloride: 101 mmol/L (ref 96–106)
Creatinine, Ser: 0.85 mg/dL (ref 0.76–1.27)
Glucose: 103 mg/dL — ABNORMAL HIGH (ref 70–99)
Potassium: 4.9 mmol/L (ref 3.5–5.2)
Sodium: 138 mmol/L (ref 134–144)
eGFR: 94 mL/min/{1.73_m2} (ref >59)

## 2022-04-30 ENCOUNTER — Telehealth (HOSPITAL_COMMUNITY): Payer: Self-pay | Admitting: *Deleted

## 2022-04-30 NOTE — Telephone Encounter (Signed)
Reaching out to patient to offer assistance regarding upcoming cardiac imaging study; pt verbalizes understanding of appt date/time, parking situation and where to check in, pre-test NPO status and medications ordered, and verified current allergies; name and call back number provided for further questions should they arise  Justin Clement RN Navigator Cardiac Imaging [facility] [phone] office [phone] cell  Patient to take '100mg'$  metoprolol tartrate two hours prior to his cardiac CT scan. He is aware to arrive at 9am.

## 2022-05-01 ENCOUNTER — Ambulatory Visit (HOSPITAL_COMMUNITY)
Admission: RE | Admit: 2022-05-01 | Discharge: 2022-05-01 | Disposition: A | Discharge status: Home / Self Care | Payer: PPO | Source: Ambulatory Visit | Attending: Cardiology | Admitting: Cardiology

## 2022-05-01 DIAGNOSIS — R072 Precordial pain: Secondary | ICD-10-CM | POA: Diagnosis not present

## 2022-05-01 DIAGNOSIS — R931 Abnormal findings on diagnostic imaging of heart and coronary circulation: Secondary | ICD-10-CM | POA: Insufficient documentation

## 2022-05-01 DIAGNOSIS — I2510 Atherosclerotic heart disease of native coronary artery without angina pectoris: Secondary | ICD-10-CM | POA: Diagnosis not present

## 2022-05-01 MED ORDER — NITROGLYCERIN 0.4 MG SL SUBL
0.8000 mg | SUBLINGUAL_TABLET | Freq: Once | SUBLINGUAL | Status: AC
  Administered 2022-05-01: 0.8 mg via SUBLINGUAL

## 2022-05-01 MED ORDER — NITROGLYCERIN 0.4 MG SL SUBL
SUBLINGUAL_TABLET | SUBLINGUAL | Status: AC
  Filled 2022-05-01: qty 2

## 2022-05-01 MED ORDER — IOHEXOL 350 MG/ML SOLN
115.0000 mL | Freq: Once | INTRAVENOUS | Status: AC | PRN
  Administered 2022-05-01: 115 mL via INTRAVENOUS

## 2022-05-02 ENCOUNTER — Other Ambulatory Visit: Payer: Self-pay | Admitting: Cardiology

## 2022-05-02 ENCOUNTER — Telehealth: Payer: Self-pay | Admitting: Cardiology

## 2022-05-02 DIAGNOSIS — R931 Abnormal findings on diagnostic imaging of heart and coronary circulation: Secondary | ICD-10-CM

## 2022-05-02 NOTE — Progress Notes (Unsigned)
FFR order 

## 2022-05-02 NOTE — Telephone Encounter (Signed)
Pt is calling in regards to results and requesting call back.

## 2022-05-03 ENCOUNTER — Ambulatory Visit (HOSPITAL_BASED_OUTPATIENT_CLINIC_OR_DEPARTMENT_OTHER)
Admission: RE | Admit: 2022-05-03 | Discharge: 2022-05-03 | Disposition: A | Discharge status: Home / Self Care | Payer: PPO | Source: Ambulatory Visit | Attending: Cardiology | Admitting: Cardiology

## 2022-05-03 DIAGNOSIS — R931 Abnormal findings on diagnostic imaging of heart and coronary circulation: Secondary | ICD-10-CM

## 2022-05-04 ENCOUNTER — Other Ambulatory Visit: Payer: Self-pay

## 2022-05-04 DIAGNOSIS — E782 Mixed hyperlipidemia: Secondary | ICD-10-CM

## 2022-05-04 MED ORDER — ROSUVASTATIN CALCIUM 20 MG PO TABS: 20.0000 mg | ORAL_TABLET | Freq: Every day | ORAL | 3 refills | Status: DC

## 2022-05-08 ENCOUNTER — Other Ambulatory Visit: Payer: Self-pay

## 2022-05-08 ENCOUNTER — Telehealth: Payer: Self-pay | Admitting: Cardiology

## 2022-05-08 ENCOUNTER — Telehealth: Payer: Self-pay

## 2022-05-08 ENCOUNTER — Telehealth: Payer: Self-pay | Admitting: Physician Assistant

## 2022-05-08 MED ORDER — ATORVASTATIN CALCIUM 20 MG PO TABS: 20.0000 mg | ORAL_TABLET | Freq: Every day | ORAL | 3 refills | Status: DC

## 2022-05-08 NOTE — Telephone Encounter (Signed)
   The patient called the answering service after-hours today. The patient was started on rosuvastatin 2 days ago. He states he started itching yesterday and called someone at our office and was told to go to urgent care but decided not to because of being around sick people if he went. He has been taking Benadryl every 4 hours but continues to have ongoing itching and diffuse rash particularly worse on his chest and buttocks. He denies any difficulty breathing, wheezing, vomiting, respiratory distress, or swelling. He has Zyrtec but no Pepcid. I advised he take Zyrtec '10mg'$  now in addition to his Benadryl and seek care in urgent care ASAP this morning. He asked if Dr. Bettina Gavia would instead see him today. I told him I do not have the ability to schedule after hours but the office opens in a few minutes and he plans to call back. I told him do not take any further rosuvastatin at this time. Will forward to Dr. Bettina Gavia for further review. The patient verbalized understanding and gratitude.  Charlie Pitter, PA-C

## 2022-05-08 NOTE — Telephone Encounter (Signed)
Called the patient and he reported that he had started the Crestor last Thursday and over the weekend he had started becoming more itchy and he also developed hives on his back chest and buttocks. I spoke to Dr. Agustin Cree regarding the patient's symptoms and he recommended that he stop taking the Crestor and switch to Lipitor 20 mg daily. His Crestor was discontinued and the new medication was entered into Epic and sent to the patient's pharmacy. He was also advised to continue taking Benadryl for the hives and the itching. Patient was appreciative for the call and had no further questions at this time.

## 2022-05-08 NOTE — Telephone Encounter (Signed)
Results reviewed with pt as per Dr. Wendy Poet note. Pt agreed to come in to discuss CT.  Pt verbalized understanding and had no additional questions. Routed to PCP

## 2022-05-08 NOTE — Telephone Encounter (Signed)
Pt c/o medication issue:  1. Name of Medication:  rosuvastatin (CRESTOR) 20 MG tablet   2. How are you currently taking this medication (dosage and times per day)? Take 1 tablet (20 mg total) by mouth daily.   3. Are you having a reaction (difficulty breathing--STAT)?   4. What is your medication issue? Patient has taking this medication for two days, he states he broke out in a rash all over his body and it is itchy.  He states he has been taking benadryl every 4 hours.

## 2022-05-22 ENCOUNTER — Telehealth: Payer: Self-pay | Admitting: Cardiology

## 2022-05-22 DIAGNOSIS — I10 Essential (primary) hypertension: Secondary | ICD-10-CM | POA: Diagnosis not present

## 2022-05-22 DIAGNOSIS — I2089 Other forms of angina pectoris: Secondary | ICD-10-CM | POA: Diagnosis not present

## 2022-05-22 NOTE — Telephone Encounter (Signed)
Spoke with Barbera Setters, RN at [facility] cardiac rehab and geve ok to continue with cardiac rehab per Dr. Bettina Gavia.

## 2022-05-22 NOTE — Telephone Encounter (Signed)
Caller stated they admitted the patient in their rehab program.  Caller wanted Dr. Bettina Gavia to be aware that the patient's CTA on 1/16 confirms total occlusion of the RCA in it's mid distal portion.  Caller wants a call back confirm it is OK for the patient to be admitted into their program.

## 2022-05-22 NOTE — Progress Notes (Unsigned)
Cardiology Office Note:    Date:  05/23/2022   ID:  Justin Bailey, DOB 10-30-1950, MRN [id number]  PCP:  Townsend Roger, MD  Cardiologist:  Shirlee More, MD    Referring MD: Townsend Roger, MD    ASSESSMENT:    1. Coronary artery disease of native artery of native heart with stable angina pectoris ([place])   2. Agatston coronary artery calcium score between 200 and 399   3. Essential hypertension   4. Mixed hyperlipidemia    PLAN:    In order of problems listed above:  Cardiac CTA definitively shows the presence of a high calcium score and CAD with occlusion of the right coronary artery and moderate nonflow limiting stenosis left circumflex marginal.  He does not require revascularization and optimize medical therapy continue aspirin antihypertensive check lipid profile may require second agent to achieve goal LDL less than 50-55 and screen for LP(a) Continue his current antihypertensive amlodipine Continue statin we will recheck his lipid profile and LP(a) today   Next appointment: 6 MONTHS   Medication Adjustments/Labs and Tests Ordered: Current medicines are reviewed at length with the patient today.  Concerns regarding medicines are outlined above.  No orders of the defined types were placed in this encounter.  No orders of the defined types were placed in this encounter.   Chief Complaint  Patient presents with   Follow-up  He has questions about his cardiac CTA  History of Present Illness:    Justin Bailey is a 72 y.o. male with a hx of exertional chest pain hypertension hyperlipidemia and subsequent abnormal coronary artery calcium score last seen 04/19/2022.  He had cardiac CTA reported 05/02/2022 coronary calcium score was severely elevated 333/69th percentile right coronary artery is occluded in its mid portion there is moderate stenosis of the marginal branch.  I reviewed the study felt he is best served with medical therapy and advised cardiac  rehabilitation.  Compliance with diet, lifestyle and medications: Yes  I reviewed the results of his cardiac CTA with him His high coronary artery calcium score needs to optimize lipid-lowering therapy to an LDL less than 50-55 will also check for LP(a) He has a occasional chest discomfort with activity I think he would benefit from cardiac rehab and he is initiated His right coronary artery was occluded and marginal branch stenosis that was nonflow restricting this time would not advise revascularization He tolerates his statin without muscle pain or weakness He was concerned about different blood pressures in his arms I checked him in the office myself 120/60 in the right arm 130/60 left arm Past Medical History:  Diagnosis Date   BPH (benign prostatic hyperplasia)    Colon polyp    Hypertension     Past Surgical History:  Procedure Laterality Date   TONSILLECTOMY     VASECTOMY      Current Medications: Current Meds  Medication Sig   amLODipine (NORVASC) 10 MG tablet Take 1 tablet (10 mg total) by mouth daily.   amoxicillin (AMOXIL) 500 MG tablet Take 2,000 mg by mouth as needed (Dental appointments). 4 tablets 1 hour prior to dental appointments   aspirin EC 81 MG tablet Take 1 tablet (81 mg total) by mouth daily. Swallow whole.   atorvastatin (LIPITOR) 20 MG tablet Take 1 tablet (20 mg total) by mouth daily.   Docusate Calcium (STOOL SOFTENER PO) Take 3 tablets by mouth daily.   lisinopril (ZESTRIL) 40 MG tablet Take 1 tablet (40 mg total) by mouth  daily.   Multiple Vitamins-Minerals (CENTRUM SILVER 50+MEN PO) Take 1 tablet by mouth daily.   nitroGLYCERIN (NITROSTAT) 0.3 MG SL tablet Place 1 tablet (0.3 mg total) under the tongue every 5 (five) minutes as needed for chest pain (if pain not gone after 3 doses, call 911).   sildenafil (VIAGRA) 50 MG tablet Take 50 mg by mouth daily as needed for erectile dysfunction.     Allergies:   Rosuvastatin   Social History    Socioeconomic History   Marital status: Single    Spouse name: Not on file   Number of children: Not on file   Years of education: Not on file   Highest education level: Not on file  Occupational History   Not on file  Tobacco Use   Smoking status: Never   Smokeless tobacco: Former    Types: Snuff    Quit date: 03/2020  Substance and Sexual Activity   Alcohol use: Never   Drug use: Never   Sexual activity: Not on file  Other Topics Concern   Not on file  Social History Narrative   Not on file   Social Determinants of Health   Financial Resource Strain: Not on file  Food Insecurity: Not on file  Transportation Needs: Not on file  Physical Activity: Not on file  Stress: Not on file  Social Connections: Not on file     Family History: The patient's family history includes Cancer in his mother; Diabetes in his mother; Hypertension in his mother; Thyroid disease in his mother. ROS:   Please see the history of present illness.    All other systems reviewed and are negative.  EKGs/Labs/Other Studies Reviewed:    The following studies were reviewed today:   Recent Labs: 04/27/2022: BUN 16; Creatinine, Ser 0.85; Potassium 4.9; Sodium 138  Recent Lipid Panel 10/30/2021 cholesterol 128 LDL 77 triglycerides 76  Physical Exam:    VS:  BP 130/60   Pulse 78   Ht '5\' 10"'$  (1.778 m)   Wt 267 lb (121.1 kg)   SpO2 94%   BMI 38.31 kg/m     Wt Readings from Last 3 Encounters:  05/23/22 267 lb (121.1 kg)  04/19/22 264 lb (119.7 kg)  03/23/22 269 lb 0.8 oz (122 kg)     GEN:  Well nourished, well developed in no acute distress HEENT: Normal NECK: No JVD; No carotid bruits LYMPHATICS: No lymphadenopathy CARDIAC: RRR, no murmurs, rubs, gallops RESPIRATORY:  Clear to auscultation without rales, wheezing or rhonchi  ABDOMEN: Soft, non-tender, non-distended MUSCULOSKELETAL:  No edema; No deformity  SKIN: Warm and dry NEUROLOGIC:  Alert and oriented x 3 PSYCHIATRIC:   Normal affecT   Seen with Truddie Hidden RN chaperone   Signed, Shirlee More, MD  05/23/2022 3:10 PM    [facility]

## 2022-05-23 ENCOUNTER — Encounter: Payer: Self-pay | Admitting: Cardiology

## 2022-05-23 ENCOUNTER — Ambulatory Visit: Payer: PPO | Attending: Cardiology | Admitting: Cardiology

## 2022-05-23 VITALS — BP 130/60 | HR 78 | Ht 70.0 in | Wt 267.0 lb

## 2022-05-23 DIAGNOSIS — R931 Abnormal findings on diagnostic imaging of heart and coronary circulation: Secondary | ICD-10-CM

## 2022-05-23 DIAGNOSIS — E782 Mixed hyperlipidemia: Secondary | ICD-10-CM

## 2022-05-23 DIAGNOSIS — I10 Essential (primary) hypertension: Secondary | ICD-10-CM | POA: Diagnosis not present

## 2022-05-23 DIAGNOSIS — I25118 Atherosclerotic heart disease of native coronary artery with other forms of angina pectoris: Secondary | ICD-10-CM | POA: Diagnosis not present

## 2022-05-23 NOTE — Patient Instructions (Signed)
Medication Instructions:  Your physician recommends that you continue on your current medications as directed. Please refer to the Current Medication list given to you today.  *If you need a refill on your cardiac medications before your next appointment, please call your pharmacy*   Lab Work: Your physician recommends that you return for lab work in:   Labs today: Lipids, LPa  If you have labs (blood work) drawn today and your tests are completely normal, you will receive your results only by: MyChart Message (if you have [place]) OR A paper copy in the mail If you have any lab test that is abnormal or we need to change your treatment, we will call you to review the results.   Testing/Procedures: None   Follow-Up: At [facility], you and your health needs are our priority.  As part of our continuing mission to provide you with exceptional heart care, we have created designated Provider Care Teams.  These Care Teams include your primary Cardiologist (physician) and Advanced Practice Providers (APPs -  Physician Assistants and Nurse Practitioners) who all work together to provide you with the care you need, when you need it.  We recommend signing up for the patient portal called "MyChart".  Sign up information is provided on this After Visit Summary.  MyChart is used to connect with patients for Virtual Visits (Telemedicine).  Patients are able to view lab/test results, encounter notes, upcoming appointments, etc.  Non-urgent messages can be sent to your provider as well.   To learn more about what you can do with MyChart, go to [email or web address].    Your next appointment:   6 month(s)  Provider:   Shirlee More, MD    Other Instructions None  This visit was accompanied by Truddie Hidden.

## 2022-05-24 LAB — LIPOPROTEIN A (LPA): Lipoprotein (a): 19.1 nmol/L (ref <75.0)

## 2022-05-24 LAB — LIPID PANEL
Chol/HDL Ratio: 3 ratio (ref 0.0–5.0)
Cholesterol, Total: 102 mg/dL (ref 100–199)
HDL: 34 mg/dL — ABNORMAL LOW (ref >39)
LDL Chol Calc (NIH): 53 mg/dL (ref 0–99)
Triglycerides: 67 mg/dL (ref 0–149)
VLDL Cholesterol Cal: 15 mg/dL (ref 5–40)

## 2022-05-25 ENCOUNTER — Telehealth: Payer: Self-pay | Admitting: Cardiology

## 2022-05-25 NOTE — Telephone Encounter (Signed)
Results reviewed with pt as per Dr. Munley's note.  Pt verbalized understanding and had no additional questions. Routed to PCP  

## 2022-05-25 NOTE — Telephone Encounter (Signed)
Patient is returning call in regards to results. Requesting return call.

## 2022-06-15 DIAGNOSIS — I2089 Other forms of angina pectoris: Secondary | ICD-10-CM | POA: Diagnosis not present

## 2022-07-16 DIAGNOSIS — I2089 Other forms of angina pectoris: Secondary | ICD-10-CM | POA: Diagnosis not present

## 2022-07-19 ENCOUNTER — Ambulatory Visit: Payer: PPO | Admitting: Cardiology

## 2022-07-31 ENCOUNTER — Encounter: Payer: Self-pay | Admitting: Internal Medicine

## 2022-07-31 ENCOUNTER — Ambulatory Visit: Payer: PPO | Admitting: Internal Medicine

## 2022-07-31 VITALS — BP 142/82 | HR 72 | Temp 98.2°F | Resp 16 | Ht 70.0 in | Wt 255.8 lb

## 2022-07-31 DIAGNOSIS — I10 Essential (primary) hypertension: Secondary | ICD-10-CM | POA: Diagnosis not present

## 2022-07-31 DIAGNOSIS — E7800 Pure hypercholesterolemia, unspecified: Secondary | ICD-10-CM

## 2022-07-31 DIAGNOSIS — M150 Primary generalized (osteo)arthritis: Secondary | ICD-10-CM

## 2022-07-31 DIAGNOSIS — I700 Atherosclerosis of aorta: Secondary | ICD-10-CM | POA: Diagnosis not present

## 2022-07-31 DIAGNOSIS — N400 Enlarged prostate without lower urinary tract symptoms: Secondary | ICD-10-CM

## 2022-07-31 DIAGNOSIS — Z6836 Body mass index (BMI) 36.0-36.9, adult: Secondary | ICD-10-CM

## 2022-07-31 DIAGNOSIS — I25118 Atherosclerotic heart disease of native coronary artery with other forms of angina pectoris: Secondary | ICD-10-CM | POA: Insufficient documentation

## 2022-07-31 DIAGNOSIS — M159 Polyosteoarthritis, unspecified: Secondary | ICD-10-CM | POA: Diagnosis not present

## 2022-07-31 DIAGNOSIS — Z0000 Encounter for general adult medical examination without abnormal findings: Secondary | ICD-10-CM

## 2022-07-31 HISTORY — DX: Primary generalized (osteo)arthritis: M15.0

## 2022-07-31 HISTORY — DX: Body mass index (BMI) 36.0-36.9, adult: Z68.36

## 2022-07-31 HISTORY — DX: Morbid (severe) obesity due to excess calories: E66.01

## 2022-07-31 MED ORDER — SILDENAFIL CITRATE 50 MG PO TABS: 50.0000 mg | ORAL_TABLET | Freq: Every day | ORAL | 5 refills | Status: AC | PRN

## 2022-07-31 MED ORDER — PNEUMOCOCCAL VAC POLYVALENT 25 MCG/0.5ML IJ INJ: 0.5000 mL | INJECTION | INTRAMUSCULAR | 0 refills | Status: AC

## 2022-07-31 NOTE — Assessment & Plan Note (Signed)
He is slowly trying to lose weight.  He has morbid obesity associated with CAD, HTN and HCL.  I want him to reduce his calories, eat healthy, continue exercise and lose weight.

## 2022-07-31 NOTE — Progress Notes (Signed)
Preventive Screening-Counseling & Management     Justin Bailey is a 72 y.o. male who presents for Medicare Annual/Subsequent preventive examination.  Justin Bailey is a 72 year old Caucasian/White male who presents for his annual wellness exam. He is due for the following health maintenance studies: screening labs. This patient's past medical history Benign Prostatic Hypertrophy, Colon Polyp, and Hypertension.   His last eye exam was in 2022 and he states his vision is doing well. He did have a CT virtual CT colonoscopy done on 08/15/2020 which showed no significant polyps or masses. He did have diverticulosis and mention of atherosclerotic calcification of abdominal aorta and branch vessels. He had a prior CT virtual colonoscopy on 02/2016 and this showed mild diverticulosis but it was otherwise normal. His last colonoscopy prior to that was done on 09/2010 by Dr. Charm Barges which showed just diverticulosis. He has had a history of colon polyps where Dr. Charm Barges felt that due to the patient's severe angulation of his bowel that he does not recommend colonoscopy but wants him to have CT colography every 5 years. He does have some BPH but denies any problems with urination. He does exercise on a treadmill 3 days a week. He does get yearly flu vaccines. He had a prevnar 13 vaccine in 07/2021.  He only had one shingrix vaccine in 07/2021 and never had the 2nd vaccine.  He has had 2 COVID-19 vaccines but no boosters. The patient denies any depression, anxiety or memory loss. The patient is on an ASA  daily.   The patient is a 72 year old Caucasian/White male who presents for a follow-up evaluation of hypertension.  This past year, his BP was not controlled and we added amlodipine to his regimen.  Since his last visit, there has been no problems. The patient has been checking his blood pressure at home. He states his systolic blood pressure is running 120-130's.  The patient's current medications include:  lisinopril 40 mg daily and amlodipine  daily. The patient has been tolerating his medications well. The patient denies any headache, visual changes, dizziness, lightheadness, chest pain, shortness of breath, orthopnea, weakness/numbness, and edema.  He reports there have been no other symptoms noted.  He did not take his BP meds this morning.     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  We did an EKG and this showed NSR.   I did refer him to cardiology where he does have CAD with stable angina pectoris.  They performed a cardiac CTA on 05/02/2022 showing a coronary calcium score which was severely elevated 333/69th percentile with his right coronary artery totally occluded in its mid portion and there was moderate stenosis of the marginal branch.  Cardiology wanted to do medical management and he was started on cardiac rehabilitation which he is going 3 days a week.  Today, he denies any further chest tightness/pressure.  When he exercises, he denies any chest pain/pressure or SOB.  He was placed initially on crestor but this caused hives.  He was then placed on atorvastatin but he denies any myalgias or fatigue.    Justin Bailey also underwent a right total knee arthroplasty on 09/28/2020.  He had right knee pain for the last 10-12 years.  He has a history of degenerative arthritis of his knee where he takes NSAIDS with minimal relief.  He had never had an injection of steriods in  his knees.  He has completed therapy and has no problems with his knees today.        Are there smokers in your home (other than you)? No  Risk Factors Current exercise habits:  as above   Dietary issues discussed: none   Depression Screen (Note: if answer to either of the following is "Yes", a more complete depression screening is indicated)   Over the past two weeks, have you felt down, depressed or hopeless? No  Over the past two weeks, have  you felt little interest or pleasure in doing things? No  Have you lost interest or pleasure in daily life? No  Do you often feel hopeless? No  Do you cry easily over simple problems? No  Activities of Daily Living In your present state of health, do you have any difficulty performing the following activities?:  Driving? No Managing money?  No Feeding yourself? No Getting from bed to chair? No Climbing a flight of stairs? No Preparing food and eating?: No Bathing or showering? No Getting dressed: No Getting to the toilet? No Using the toilet:No Moving around from place to place: No In the past year have you fallen or had a near fall?:No   Are you sexually active?  No  Do you have more than one partner?  No  Hearing Difficulties: No Do you often ask people to speak up or repeat themselves? No Do you experience ringing or noises in your ears? No Do you have difficulty understanding soft or whispered voices? No   Do you feel that you have a problem with memory? No  Do you often misplace items? No  Do you feel safe at home?  Yes  Cognitive Testing  Alert? Yes  Normal Appearance?Yes  Oriented to person? Yes  Place? Yes   Time? Yes  Recall of three objects?  Yes  Can perform simple calculations? Yes  Displays appropriate judgment?Yes  Can read the correct time from a watch face?Yes  Fall Risk Prevention  Any stairs in or around the home? Yes  If so, are there any without handrails? Yes  Home free of loose throw rugs in walkways, pet beds, electrical cords, etc? Yes  Adequate lighting in your home to reduce risk of falls? Yes  Use of a cane, walker or w/c? No    Time Up and Go  Was the test performed? Yes .  Length of time to ambulate 10 feet: 7 sec.   Gait steady and fast without use of assistive device    Advanced Directives have been discussed with the patient? Yes   List the Names of Other Physician/Practitioners you currently use: Patient Care Team: Crist Fat, MD as PCP - General (Internal Medicine)    Past Medical History:  Diagnosis Date   Aortic atherosclerosis    BPH (benign prostatic hyperplasia)    CAD (coronary artery disease)    Colon polyp    Hypercholesterolemia    Hypertension    Osteoarthritis     Past Surgical History:  Procedure Laterality Date   TONSILLECTOMY     VASECTOMY        Current Medications  Current Outpatient Medications  Medication Sig Dispense Refill   amLODipine (NORVASC) 10 MG tablet Take 1 tablet (10 mg total) by mouth daily. 30 tablet 2   aspirin EC 81 MG tablet Take 1 tablet (81 mg total) by mouth daily. Swallow whole. 90 tablet 3   atorvastatin (LIPITOR) 20 MG tablet Take 1  tablet (20 mg total) by mouth daily. 90 tablet 3   Docusate Calcium (STOOL SOFTENER PO) Take 3 tablets by mouth daily.     lisinopril (ZESTRIL) 40 MG tablet Take 1 tablet (40 mg total) by mouth daily. 90 tablet 1   Multiple Vitamins-Minerals (CENTRUM SILVER 50+MEN PO) Take 1 tablet by mouth daily.     nitroGLYCERIN (NITROSTAT) 0.3 MG SL tablet Place 1 tablet (0.3 mg total) under the tongue every 5 (five) minutes as needed for chest pain (if pain not gone after 3 doses, call 911). 90 tablet 12   sildenafil (VIAGRA) 50 MG tablet Take 50 mg by mouth daily as needed for erectile dysfunction.     No current facility-administered medications for this visit.    Allergies Rosuvastatin   Social History Social History   Tobacco Use   Smoking status: Never   Smokeless tobacco: Former    Types: Snuff    Quit date: 03/2020  Substance Use Topics   Alcohol use: Never     Review of Systems Review of Systems  Constitutional:  Negative for chills, fever, malaise/fatigue and weight loss.  HENT:  Negative for hearing loss and tinnitus.   Eyes:  Negative for blurred vision and double vision.  Respiratory:  Negative for cough, shortness of breath and wheezing.   Cardiovascular:  Negative for chest pain, palpitations and leg  swelling.  Gastrointestinal:  Negative for abdominal pain, blood in stool, constipation, diarrhea, heartburn, melena, nausea and vomiting.  Genitourinary:  Negative for frequency and hematuria.  Musculoskeletal:  Negative for myalgias.  Skin:  Negative for itching and rash.  Neurological:  Negative for dizziness, weakness and headaches.  Psychiatric/Behavioral:  Negative for depression. The patient is not nervous/anxious.      Physical Exam:      Body mass index is 36.7 kg/m. BP (!) 142/82   Pulse 72   Temp 98.2 F (36.8 C)   Resp 16   Ht 5\' 10"  (1.778 m)   Wt 255 lb 12.8 oz (116 kg)   SpO2 95%   BMI 36.70 kg/m   Physical Exam Constitutional:      Appearance: Normal appearance. He is not ill-appearing.  HENT:     Head: Normocephalic and atraumatic.     Right Ear: Tympanic membrane, ear canal and external ear normal.     Left Ear: Tympanic membrane, ear canal and external ear normal.     Nose: Nose normal. No congestion or rhinorrhea.     Mouth/Throat:     Mouth: Mucous membranes are dry.     Pharynx: Oropharynx is clear. No oropharyngeal exudate or posterior oropharyngeal erythema.  Eyes:     General: No scleral icterus.    Conjunctiva/sclera: Conjunctivae normal.     Pupils: Pupils are equal, round, and reactive to light.  Neck:     Vascular: No carotid bruit.  Cardiovascular:     Rate and Rhythm: Normal rate and regular rhythm.     Pulses: Normal pulses.     Heart sounds: No murmur heard.    No friction rub. No gallop.  Pulmonary:     Effort: Pulmonary effort is normal. No respiratory distress.     Breath sounds: No wheezing, rhonchi or rales.  Abdominal:     General: Abdomen is flat. Bowel sounds are normal. There is no distension.     Palpations: Abdomen is soft.     Tenderness: There is no abdominal tenderness.  Musculoskeletal:     Cervical back: Neck supple.  No tenderness.     Right lower leg: No edema.     Left lower leg: No edema.  Lymphadenopathy:      Cervical: No cervical adenopathy.  Skin:    General: Skin is warm and dry.     Findings: No rash.  Neurological:     General: No focal deficit present.     Mental Status: He is alert and oriented to person, place, and time.  Psychiatric:        Mood and Affect: Mood normal.        Behavior: Behavior normal.      Assessment:      Essential hypertension  Coronary artery disease of native artery of native heart with stable angina pectoris  Hypercholesterolemia  Aortic atherosclerosis  Primary osteoarthritis involving multiple joints  Benign prostatic hyperplasia without lower urinary tract symptoms  BMI 36.0-36.9,adult  Morbid obesity    Plan:     During the course of the visit the patient was educated and counseled about appropriate screening and preventive services including:   Pneumococcal vaccine  Influenza vaccine Colorectal cancer screening  Diet review for nutrition referral? Yes ____  Not Indicated __X__   Patient Instructions (the written plan) was given to the patient.  Aortic atherosclerosis Continue risk factor modification as described below.  Coronary artery disease of native artery of native heart with stable angina pectoris Cardiology performed a cardiac CTA on 05/02/2022 showing a coronary calcium score which was severely elevated 333 and he was 69th percentile.  His right coronary artery was totally occluded in its mid portion and there was moderate stenosis of the marginal branch.  Cardiology wanted to do medical management and he was started on cardiac rehabilitation.  We will continue risk factor modification at this time.  Essential hypertension He did not take his BP meds this morning but his BP looked good at Dr. Hulen Shouts.  We will see what his BP is doing on his next visit.  Primary osteoarthritis involving multiple joints He can continue on tylenol as needed.  BPH (benign prostatic hyperplasia) We will check his PSA today.  BMI  36.0-36.9,adult He is slowly trying to lose weight.  He has morbid obesity associated with CAD, HTN and HCL.  I want him to reduce his calories, eat healthy, continue exercise and lose weight.  Hypercholesterolemia His goal LDL <55 with his history of CAD.  We will check his FLP today.  Morbid obesity Plan as above.   Prevention Health maintenance was discussed.  He needs a pneumovax 23 vaccine.  We will obtain some yearly labs.   Medicare Attestation I have personally reviewed: The patient's medical and social history Their use of alcohol, tobacco or illicit drugs Their current medications and supplements The patient's functional ability including ADLs,fall risks, home safety risks, cognitive, and hearing and visual impairment Diet and physical activities Evidence for depression or mood disorders  The patient's weight, height, and BMI have been recorded in the chart.  I have made referrals, counseling, and provided education to the patient based on review of the above and I have provided the patient with a written personalized care plan for preventive services.     Crist Fat, MD   07/31/2022

## 2022-07-31 NOTE — Assessment & Plan Note (Signed)
He can continue on tylenol as needed.

## 2022-07-31 NOTE — Assessment & Plan Note (Signed)
His goal LDL <55 with his history of CAD.  We will check his FLP today.

## 2022-07-31 NOTE — Assessment & Plan Note (Signed)
Cardiology performed a cardiac CTA on 05/02/2022 showing a coronary calcium score which was severely elevated 333 and he was 69th percentile.  His right coronary artery was totally occluded in its mid portion and there was moderate stenosis of the marginal branch.  Cardiology wanted to do medical management and he was started on cardiac rehabilitation.  We will continue risk factor modification at this time.

## 2022-07-31 NOTE — Assessment & Plan Note (Signed)
Continue risk factor modification as described below.

## 2022-07-31 NOTE — Assessment & Plan Note (Signed)
He did not take his BP meds this morning but his BP looked good at Dr. Hulen Shouts.  We will see what his BP is doing on his next visit.

## 2022-07-31 NOTE — Assessment & Plan Note (Signed)
Plan as above.  

## 2022-07-31 NOTE — Assessment & Plan Note (Signed)
We will check his PSA today. 

## 2022-08-01 LAB — CBC WITH DIFFERENTIAL/PLATELET
Basophils Absolute: 0.1 10*3/uL (ref 0.0–0.2)
Basos: 1 %
EOS (ABSOLUTE): 0.5 10*3/uL — ABNORMAL HIGH (ref 0.0–0.4)
Eos: 5 %
Hematocrit: 44.1 % (ref 37.5–51.0)
Hemoglobin: 14.8 g/dL (ref 13.0–17.7)
Immature Grans (Abs): 0 10*3/uL (ref 0.0–0.1)
Immature Granulocytes: 0 %
Lymphocytes Absolute: 2 10*3/uL (ref 0.7–3.1)
Lymphs: 20 %
MCH: 29.5 pg (ref 26.6–33.0)
MCHC: 33.6 g/dL (ref 31.5–35.7)
MCV: 88 fL (ref 79–97)
Monocytes Absolute: 0.9 10*3/uL (ref 0.1–0.9)
Monocytes: 9 %
Neutrophils Absolute: 6.5 10*3/uL (ref 1.4–7.0)
Neutrophils: 65 %
Platelets: 286 10*3/uL (ref 150–450)
RBC: 5.01 x10E6/uL (ref 4.14–5.80)
RDW: 12.5 % (ref 11.6–15.4)
WBC: 10 10*3/uL (ref 3.4–10.8)

## 2022-08-01 LAB — CMP14 + ANION GAP
ALT: 22 IU/L (ref 0–44)
AST: 25 IU/L (ref 0–40)
Albumin/Globulin Ratio: 1.7 (ref 1.2–2.2)
Albumin: 4.4 g/dL (ref 3.9–4.9)
Alkaline Phosphatase: 109 IU/L (ref 44–121)
Anion Gap: 14 mmol/L (ref 10.0–18.0)
BUN/Creatinine Ratio: 17 (ref 10–24)
BUN: 14 mg/dL (ref 8–27)
Bilirubin Total: 0.4 mg/dL (ref 0.0–1.2)
CO2: 21 mmol/L (ref 20–29)
Calcium: 9.3 mg/dL (ref 8.6–10.2)
Chloride: 103 mmol/L (ref 96–106)
Creatinine, Ser: 0.82 mg/dL (ref 0.76–1.27)
Globulin, Total: 2.6 g/dL (ref 1.5–4.5)
Glucose: 94 mg/dL (ref 70–99)
Potassium: 4.8 mmol/L (ref 3.5–5.2)
Sodium: 138 mmol/L (ref 134–144)
Total Protein: 7 g/dL (ref 6.0–8.5)
eGFR: 95 mL/min/{1.73_m2} (ref >59)

## 2022-08-01 LAB — LIPID PANEL
Chol/HDL Ratio: 2.5 ratio (ref 0.0–5.0)
Cholesterol, Total: 96 mg/dL — ABNORMAL LOW (ref 100–199)
HDL: 38 mg/dL — ABNORMAL LOW (ref >39)
LDL Chol Calc (NIH): 45 mg/dL (ref 0–99)
Triglycerides: 52 mg/dL (ref 0–149)
VLDL Cholesterol Cal: 13 mg/dL (ref 5–40)

## 2022-08-01 LAB — HEMOGLOBIN A1C
Est. average glucose Bld gHb Est-mCnc: 105 mg/dL
Hgb A1c MFr Bld: 5.3 % (ref 4.8–5.6)

## 2022-08-01 LAB — PSA: Prostate Specific Ag, Serum: 0.6 ng/mL (ref 0.0–4.0)

## 2022-08-01 LAB — TSH: TSH: 1.07 u[IU]/mL (ref 0.450–4.500)

## 2022-08-15 DIAGNOSIS — I2089 Other forms of angina pectoris: Secondary | ICD-10-CM | POA: Diagnosis not present

## 2022-10-01 ENCOUNTER — Other Ambulatory Visit: Payer: Self-pay | Admitting: Internal Medicine

## 2022-10-31 ENCOUNTER — Ambulatory Visit: Payer: PPO | Admitting: Internal Medicine

## 2022-10-31 ENCOUNTER — Encounter: Payer: Self-pay | Admitting: Internal Medicine

## 2022-10-31 VITALS — BP 112/64 | HR 75 | Temp 98.0°F | Resp 18 | Ht 70.0 in | Wt 251.0 lb

## 2022-10-31 DIAGNOSIS — I10 Essential (primary) hypertension: Secondary | ICD-10-CM | POA: Diagnosis not present

## 2022-10-31 NOTE — Progress Notes (Signed)
Office Visit  Subjective   Patient ID: Justin Bailey   DOB: 07-05-1950   Age: 72 y.o.   MRN: [id number]   Chief Complaint Chief Complaint  Patient presents with   Follow-up     History of Present Illness The patient is a 72 year old Caucasian/White male who presents for a follow-up evaluation of hypertension.  On his last visit, he did not take his BP pills on his yearly exam and his BP was a bit elevated.  This past year, his BP was not controlled and we added amlodipine to his regimen.  Since his last visit, there has been no problems. The patient has not been recently checking his blood pressure at home. The patient's current medications include: lisinopril 40 mg daily and amlodipine 5mg  daily. The patient has been tolerating his medications well. The patient denies any headache, visual changes, dizziness, lightheadness, chest pain, shortness of breath, orthopnea, weakness/numbness, and edema.  He reports there have been no other symptoms noted.       Past Medical History Past Medical History:  Diagnosis Date   Aortic atherosclerosis (HCC)    BPH (benign prostatic hyperplasia)    CAD (coronary artery disease)    Colon polyp    Hypercholesterolemia    Hypertension    Osteoarthritis      Allergies Allergies  Allergen Reactions   Rosuvastatin Rash     Medications  Current Outpatient Medications:    amLODipine (NORVASC) 5 MG tablet, Take 5 mg by mouth daily., Disp: , Rfl:    aspirin EC 81 MG tablet, Take 1 tablet (81 mg total) by mouth daily. Swallow whole., Disp: 90 tablet, Rfl: 3   atorvastatin (LIPITOR) 20 MG tablet, Take 1 tablet (20 mg total) by mouth daily., Disp: 90 tablet, Rfl: 3   Docusate Calcium (STOOL SOFTENER PO), Take 3 tablets by mouth daily., Disp: , Rfl:    lisinopril (ZESTRIL) 40 MG tablet, TAKE 1 TABLET(40 MG) BY MOUTH DAILY, Disp: 90 tablet, Rfl: 1   Multiple Vitamins-Minerals (CENTRUM SILVER 50+MEN PO), Take 1 tablet by mouth daily., Disp: , Rfl:     nitroGLYCERIN (NITROSTAT) 0.3 MG SL tablet, Place 1 tablet (0.3 mg total) under the tongue every 5 (five) minutes as needed for chest pain (if pain not gone after 3 doses, call 911)., Disp: 90 tablet, Rfl: 12   sildenafil (VIAGRA) 50 MG tablet, Take 1 tablet (50 mg total) by mouth daily as needed for erectile dysfunction., Disp: 10 tablet, Rfl: 5   Review of Systems Review of Systems  Constitutional:  Negative for chills and fever.  Eyes:  Negative for blurred vision and double vision.  Respiratory:  Negative for cough and shortness of breath.   Cardiovascular:  Negative for chest pain, palpitations and leg swelling.  Gastrointestinal:  Negative for abdominal pain, constipation, diarrhea, nausea and vomiting.  Musculoskeletal:  Negative for myalgias.  Neurological:  Negative for dizziness, weakness and headaches.       Objective:    Vitals BP 112/64 (BP Location: Left Arm, Patient Position: Sitting, Cuff Size: Normal)   Pulse 75   Temp 98 F (36.7 C) (Temporal)   Resp 18   Ht 5\' 10"  (1.778 m)   Wt 251 lb (113.9 kg)   SpO2 98%   BMI 36.01 kg/m    Physical Examination Physical Exam Constitutional:      Appearance: Normal appearance. He is not ill-appearing.  Cardiovascular:     Rate and Rhythm: Normal rate and regular rhythm.  Pulses: Normal pulses.     Heart sounds: No murmur heard.    No friction rub. No gallop.  Pulmonary:     Effort: Pulmonary effort is normal. No respiratory distress.     Breath sounds: No wheezing, rhonchi or rales.  Abdominal:     General: Abdomen is flat. Bowel sounds are normal. There is no distension.     Palpations: Abdomen is soft.     Tenderness: There is no abdominal tenderness.  Musculoskeletal:     Right lower leg: No edema.     Left lower leg: No edema.  Skin:    General: Skin is warm and dry.     Findings: No rash.  Neurological:     Mental Status: He is alert.        Assessment & Plan:   Essential hypertension His BP is  doing well.  We will recheck his FLP on his next visit.  I asked him to avoid salt.  We will continue his current meds.    Return in about 3 months (around 01/31/2023).   Crist Fat, MD

## 2022-10-31 NOTE — Assessment & Plan Note (Signed)
His BP is doing well.  We will recheck his FLP on his next visit.  I asked him to avoid salt.  We will continue his current meds.

## 2022-11-27 ENCOUNTER — Other Ambulatory Visit: Payer: Self-pay | Admitting: Internal Medicine

## 2022-12-13 NOTE — Progress Notes (Signed)
Cardiology Office Note:    Date:  12/14/2022   ID:  Rodrigo Ran, DOB 1950/09/11, MRN [id number]  PCP:  Crist Fat, MD  Cardiologist:  Norman Herrlich, MD    Referring MD: Crist Fat, MD    ASSESSMENT:    1. Coronary artery disease of native artery of native heart with stable angina pectoris (HCC)   2. Agatston coronary artery calcium score between 200 and 399   3. Essential hypertension   4. Mixed hyperlipidemia    PLAN:    In order of problems listed above:  He has done very well with CAD did not require percutaneous intervention he is on a good medical regimen including aspirin his statin LDL is ideal 45 screening for LP(a) is normal blood pressure is controlled and he has adopted a healthy favorable lifestyle with the addition of cardiac rehabilitation Labs are followed in his PCP office I told him I will see him in 1 year unless he is having cardiac symptoms   Next appointment: 1 year   Medication Adjustments/Labs and Tests Ordered: Current medicines are reviewed at length with the patient today.  Concerns regarding medicines are outlined above.  No orders of the defined types were placed in this encounter.  No orders of the defined types were placed in this encounter.    History of Present Illness:    Justin Bailey is a 72 y.o. male with a hx of CAD with a severely elevated coronary calcium score occlusion of the right coronary artery it is midportion and moderate stenosis left circumflex marginal coronary artery advise medical therapy hypertension hyperlipidemia last seen 05/23/2022.  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 Swallow whole.   atorvastatin (LIPITOR) 20 MG tablet Take 1 tablet (20 mg total) by mouth daily.   Docusate Calcium (STOOL SOFTENER PO) Take 3 tablets by mouth daily.   lisinopril (ZESTRIL) 40 MG tablet TAKE 1 TABLET(40 MG) BY MOUTH DAILY   Multiple Vitamins-Minerals (CENTRUM SILVER 50+MEN PO) Take 1 tablet by mouth daily.   nitroGLYCERIN (NITROSTAT) 0.3 MG SL tablet Place 1 tablet (0.3 mg total) under the tongue every 5 (five) minutes as needed for chest pain (if pain not gone after 3 doses, call 911).   sildenafil (VIAGRA) 50 MG tablet Take 1 tablet (50 mg total) by mouth daily as needed for erectile dysfunction.      EKGs/Labs/Other Studies Reviewed:    The following studies were reviewed today:  Cardiac Studies & Procedures          CT SCANS  CT CORONARY MORPH W/CTA COR W/SCORE 05/01/2022  Addendum 05/02/2022  5:33 PM ADDENDUM REPORT: 05/02/2022 17:30  CLINICAL DATA:  CP  EXAM: Cardiac/Coronary  CTA  TECHNIQUE: The patient was scanned on a [employer].  FINDINGS: A 120 kV prospective scan was triggered in the descending thoracic aorta at 111 HU's. Axial non-contrast 3 mm slices were carried out through the heart. The data set was analyzed on a dedicated work station and scored using the Agatson method. Gantry rotation speed was 250 msecs and collimation was .6 mm. No beta blockade and 0.8 mg of sl NTG was given. The 3D data set was reconstructed in 5% intervals of the 67-82 % of the R-R cycle. Diastolic phases  were analyzed on a dedicated work station using MPR, MIP and VRT modes. The patient received 80 cc of contrast.  Aorta: Normal size.  No calcifications.  No dissection.  Aortic Valve:  Trileaflet.  No calcifications.  Coronary Arteries:  Normal coronary origin.  Right dominance.  RCA is a large dominant artery that gives rise to PDA and PLA. This artery is completely occluded in its mid portion. Reconstitution of flow is noted distally.  Left main is a large artery that gives rise to LAD and LCX arteries.  LAD is a large vessel that has calcified plaque in its mid portion with minimal stenosis of 0-25%.  LCX is a non-dominant artery that gives rise to one large OM1 branch. I the proximal portion of OM1 branch mixed plaque is noted with moderate stenosis of 50-69%.  Other findings:  Normal pulmonary vein drainage into the left atrium.  Normal left atrial appendage without a thrombus.  Normal size of the pulmonary artery.  IMPRESSION: 1. Coronary calcium score of 333. This was 69 percentile for age and sex matched control.  2. Normal coronary origin with right dominance.  3. CAD-RADS 5 Total coronary occlusion (100%) - mid RCA. Consider cardiac catheterization or viability assessment. Consider symptom-guided anti-ischemic pharmacotherapy as well as risk factor modification per guideline directed care.  4. Moderate stenosis of OM1 (50-69%) is noted. Study will be submitted for FFR/   Electronically Signed By: Gypsy Balsam M.D. On: 05/02/2022 17:30  Narrative EXAM: OVER-READ INTERPRETATION  CT CHEST  The following report is a limited chest CT over-read performed by radiologist Dr. Genevive Bi of [facility] on 05/01/2022. This over-read does not include interpretation of cardiac or coronary anatomy or pathology. The coronary CTA interpretation by the cardiologist is attached.  COMPARISON:  None Available.  FINDINGS: Limited view of the  lung parenchyma demonstrates no suspicious nodularity. Airways are normal.  Limited view of the mediastinum demonstrates no adenopathy. Esophagus normal.  Limited view of the upper abdomen unremarkable.  Limited view of the skeleton and chest wall is unremarkable.  IMPRESSION: No significant extracardiac findings.  Electronically Signed: By: Genevive Bi M.D. On: 05/01/2022 11:34              Recent Labs: 07/31/2022: ALT 22; BUN 14; Creatinine, Ser 0.82; Hemoglobin 14.8; Platelets 286; Potassium 4.8; Sodium 138; TSH 1.070  Recent Lipid Panel    Component Value Date/Time   CHOL 96 (L) 07/31/2022 0922   TRIG 52 07/31/2022 0922   HDL 38 (L) 07/31/2022 0922   CHOLHDL 2.5 07/31/2022 0922   LDLCALC 45 07/31/2022 0922    Physical Exam:    VS:  BP 128/80 (BP Location: Left Arm, Patient Position: Sitting, Cuff Size: Normal)   Pulse 84   Ht 5\' 11"  (1.803 m)   Wt 248 lb 12.8 oz (112.9 kg)   SpO2 95%  BMI 34.70 kg/m     Wt Readings from Last 3 Encounters:  12/14/22 248 lb 12.8 oz (112.9 kg)  10/31/22 251 lb (113.9 kg)  07/31/22 255 lb 12.8 oz (116 kg)     GEN:  Well nourished, well developed in no acute distress HEENT: Normal NECK: No JVD; No carotid bruits LYMPHATICS: No lymphadenopathy CARDIAC: RRR, no murmurs, rubs, gallops RESPIRATORY:  Clear to auscultation without rales, wheezing or rhonchi  ABDOMEN: Soft, non-tender, non-distended MUSCULOSKELETAL:  No edema; No deformity  SKIN: Warm and dry NEUROLOGIC:  Alert and oriented x 3 PSYCHIATRIC:  Normal affect    Signed, Norman Herrlich, MD  12/14/2022 11:27 AM    [facility]

## 2022-12-14 ENCOUNTER — Ambulatory Visit: Payer: PPO | Attending: Cardiology | Admitting: Cardiology

## 2022-12-14 ENCOUNTER — Encounter: Payer: Self-pay | Admitting: Cardiology

## 2022-12-14 VITALS — BP 128/80 | HR 84 | Ht 71.0 in | Wt 248.8 lb

## 2022-12-14 DIAGNOSIS — I25118 Atherosclerotic heart disease of native coronary artery with other forms of angina pectoris: Secondary | ICD-10-CM | POA: Diagnosis not present

## 2022-12-14 DIAGNOSIS — R931 Abnormal findings on diagnostic imaging of heart and coronary circulation: Secondary | ICD-10-CM | POA: Diagnosis not present

## 2022-12-14 DIAGNOSIS — E782 Mixed hyperlipidemia: Secondary | ICD-10-CM

## 2022-12-14 DIAGNOSIS — I10 Essential (primary) hypertension: Secondary | ICD-10-CM

## 2022-12-14 NOTE — Patient Instructions (Signed)

## 2022-12-19 ENCOUNTER — Telehealth: Payer: Self-pay | Admitting: Cardiology

## 2022-12-19 MED ORDER — NITROGLYCERIN 0.3 MG SL SUBL: 0.3000 mg | SUBLINGUAL_TABLET | SUBLINGUAL | 12 refills | Status: AC | PRN

## 2022-12-19 NOTE — Telephone Encounter (Signed)
*  STAT* If patient is at the pharmacy, call can be transferred to refill team.   1. Which medications need to be refilled? (please list name of each medication and dose if known) nitroGLYCERIN (NITROSTAT) 0.3 MG SL tablet   4. Which pharmacy/location (including street and city if local pharmacy) is medication to be sent to?[facility] [phone] - [place], [place] - [address] AT [address]    5. Do they need a 30 day or 90 day supply? 90

## 2023-01-31 ENCOUNTER — Ambulatory Visit: Payer: PPO | Admitting: Internal Medicine

## 2023-01-31 ENCOUNTER — Encounter: Payer: Self-pay | Admitting: Internal Medicine

## 2023-01-31 VITALS — BP 134/80 | HR 68 | Temp 98.4°F | Resp 16 | Ht 70.0 in | Wt 252.8 lb

## 2023-01-31 DIAGNOSIS — I10 Essential (primary) hypertension: Secondary | ICD-10-CM | POA: Diagnosis not present

## 2023-01-31 DIAGNOSIS — E7800 Pure hypercholesterolemia, unspecified: Secondary | ICD-10-CM | POA: Diagnosis not present

## 2023-01-31 DIAGNOSIS — Z23 Encounter for immunization: Secondary | ICD-10-CM

## 2023-01-31 DIAGNOSIS — I2510 Atherosclerotic heart disease of native coronary artery without angina pectoris: Secondary | ICD-10-CM | POA: Diagnosis not present

## 2023-01-31 MED ORDER — FLUTICASONE PROPIONATE 50 MCG/ACT NA SUSP: 1.0000 | Freq: Two times a day (BID) | NASAL | 0 refills | Status: DC

## 2023-01-31 NOTE — Assessment & Plan Note (Signed)
We will continue him on risk factor modification.  He is on an ASA.  His CAD is stable.

## 2023-01-31 NOTE — Assessment & Plan Note (Signed)
His BP is doing well.  We will continue to monitor and continue his current meds.

## 2023-01-31 NOTE — Assessment & Plan Note (Signed)
We will check his FLP on him today with his history of CAD.

## 2023-01-31 NOTE — Progress Notes (Signed)
Office Visit  Subjective   Patient ID: Justin Bailey   DOB: [birth year]   Age: 72 y.o.   MRN: [id number]   Chief Complaint Chief Complaint  Patient presents with   Follow-up     History of Present Illness The patient is a 72 year old Caucasian/White male who presents for a follow-up evaluation of hypertension.   Since his last visit, he has not had any problems.  This past year, his BP was not controlled and we added amlodipine to his regimen.  The patient has been checking his blood pressure at home.  The patient's current medications include: lisinopril 40 mg daily and amlodipine 5mg  daily. The patient has been tolerating his medications well. The patient denies any headache, visual changes, dizziness, lightheadness, chest pain, shortness of breath, orthopnea, weakness/numbness, and edema.  He reports there have been no other symptoms noted.    He also has a history of CAD.  He did see Dr. Dulce Sellar in 11/2022 and his CAD was stable.  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  We did an EKG and this showed NSR.   I did refer him to cardiology where he does have CAD with stable angina pectoris.  They performed a cardiac CTA on 05/02/2022 showing a coronary calcium score which was severely elevated 333/69th percentile with his right coronary artery totally occluded in its mid portion and there was moderate stenosis of the marginal branch.  Cardiology wanted to do medical management and he was started on cardiac rehabilitation at that time.  He continues to exercise at his home.  Today, he denies any further chest tightness/pressure.  When he exercises, he denies any chest pain/pressure or SOB.  He was placed initially on crestor but this caused hives.  He was then placed on atorvastatin but he denies any myalgias or fatigue.   The patient also  returns today for routine followup on his cholesterol. Overall, he states he is  doing well and is without any complaints or problems at this time. He specifically denies abdominal pain, nausea, vomiting, diarrhea, myalgias, and fatigue. He remains on dietary management as well as atorvastatin 20mg  qhs. She is fasting in anticipation for labs today.      Past Medical History Past Medical History:  Diagnosis Date   Aortic atherosclerosis (HCC)    BPH (benign prostatic hyperplasia)    CAD (coronary artery disease)    Colon polyp    Hypercholesterolemia    Hypertension    Osteoarthritis      Allergies Allergies  Allergen Reactions   Rosuvastatin Rash     Medications  Current Outpatient Medications:    amLODipine (NORVASC) 5 MG tablet, TAKE 1 TABLET(5 MG) BY MOUTH EVERY DAY, Disp: 90 tablet, Rfl: 0   aspirin EC 81 MG tablet, Take 1 tablet (81 mg total) by mouth daily. Swallow whole., Disp: 90 tablet, Rfl: 3   atorvastatin (LIPITOR) 20 MG tablet, Take 1 tablet (20 mg total) by mouth daily., Disp: 90 tablet, Rfl: 3   Docusate Calcium (STOOL SOFTENER PO), Take 3 tablets by mouth daily., Disp: , Rfl:    lisinopril (ZESTRIL) 40 MG tablet, TAKE 1 TABLET(40 MG) BY MOUTH DAILY, Disp: 90 tablet, Rfl: 1   Multiple Vitamins-Minerals (CENTRUM SILVER 50+MEN PO), Take 1 tablet by mouth daily., Disp: , Rfl:    nitroGLYCERIN (NITROSTAT) 0.3 MG SL tablet, Place 1 tablet (0.3 mg total)  under the tongue every 5 (five) minutes as needed for chest pain (if pain not gone after 3 doses, call 911)., Disp: 90 tablet, Rfl: 12   sildenafil (VIAGRA) 50 MG tablet, Take 1 tablet (50 mg total) by mouth daily as needed for erectile dysfunction., Disp: 10 tablet, Rfl: 5   Review of Systems Review of Systems  Constitutional:  Negative for chills, fever and malaise/fatigue.  Eyes:  Negative for blurred vision and double vision.  Respiratory:  Negative for cough and shortness of breath.   Cardiovascular:  Negative for chest pain, palpitations and leg swelling.  Gastrointestinal:  Negative for  abdominal pain, constipation, diarrhea, nausea and vomiting.  Genitourinary:  Negative for frequency.  Musculoskeletal:  Negative for myalgias.  Skin:  Negative for itching and rash.  Neurological:  Negative for dizziness, weakness and headaches.  Endo/Heme/Allergies:  Negative for polydipsia.       Objective:    Vitals BP 134/80   Pulse 68   Temp 98.4 F (36.9 C)   Resp 16   Ht 5\' 10"  (1.778 m)   Wt 252 lb 12.8 oz (114.7 kg)   SpO2 98%   BMI 36.27 kg/m    Physical Examination Physical Exam Constitutional:      Appearance: Normal appearance. He is not ill-appearing.  Neck:     Vascular: No carotid bruit.  Cardiovascular:     Rate and Rhythm: Normal rate and regular rhythm.     Pulses: Normal pulses.     Heart sounds: No murmur heard.    No friction rub. No gallop.  Pulmonary:     Effort: Pulmonary effort is normal. No respiratory distress.     Breath sounds: No wheezing, rhonchi or rales.  Abdominal:     General: Abdomen is flat. Bowel sounds are normal. There is no distension.     Palpations: Abdomen is soft.     Tenderness: There is no abdominal tenderness.  Musculoskeletal:     Right lower leg: No edema.     Left lower leg: No edema.  Skin:    General: Skin is warm and dry.     Findings: No rash.  Neurological:     General: No focal deficit present.     Mental Status: He is alert and oriented to person, place, and time.  Psychiatric:        Mood and Affect: Mood normal.        Behavior: Behavior normal.        Assessment & Plan:   Coronary artery disease involving native coronary artery of native heart without angina pectoris We will continue him on risk factor modification.  He is on an ASA.  His CAD is stable.  Essential hypertension His BP is doing well.  We will continue to monitor and continue his current meds.  Hypercholesterolemia We will check his FLP on him today with his history of CAD.    Return in about 3 months (around 05/03/2023).    Crist Fat, MD

## 2023-02-01 LAB — LIPID PANEL
Chol/HDL Ratio: 3 {ratio} (ref 0.0–5.0)
Cholesterol, Total: 99 mg/dL — ABNORMAL LOW (ref 100–199)
HDL: 33 mg/dL — ABNORMAL LOW (ref >39)
LDL Chol Calc (NIH): 53 mg/dL (ref 0–99)
Triglycerides: 56 mg/dL (ref 0–149)
VLDL Cholesterol Cal: 13 mg/dL (ref 5–40)

## 2023-02-06 IMAGING — CT CT VIRTUAL COLONOSCOPY SCREENING
2 of 9 series · 12 of 46 positions shown, 18 images · non-contrast
Comparison: 02/21/2016

CLINICAL DATA: Colon cancer screening, history of diverticulitis

EXAM:
CT VIRTUAL COLONOSCOPY SCREENING
TECHNIQUE: The patient was given a standard Mag citrate bowel preparation with
Gastrografin and barium for fluid and stool tagging respectively.
The quality of the bowel preparation is moderate. Automated CO2
insufflation of the colon was performed prior to image acquisition
and colonic distention is moderate. Image post processing was used
to generate a 3D endoluminal fly-through projection of the colon and
to electronically subtract stool/fluid as appropriate.

[Series 5: supine colon 3.00 br40 s3 cor supine · coronal · 0.93mm/px · 3 of 158 slices shown]
[im 40/158  soft-tissue]
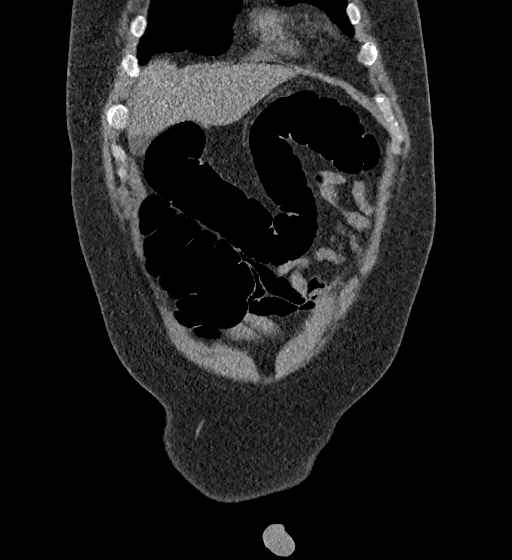
[im 79/158  soft-tissue]
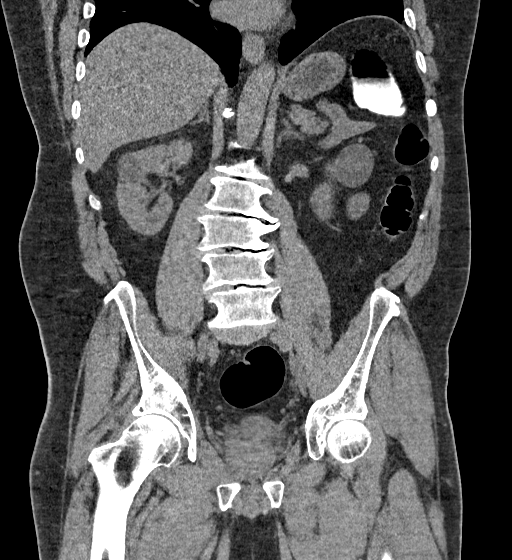
[im 118/158  soft-tissue]
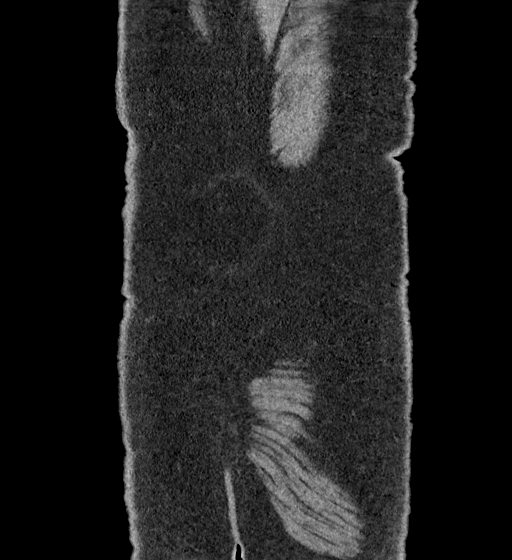

[Series 10: prone colon 1.50 br40 s3 prone thin · axial · 0.90mm/px · z∈[+1368,+1813]mm · 9 of 373 slices shown, 15 images]
[im 38/373  soft-tissue]
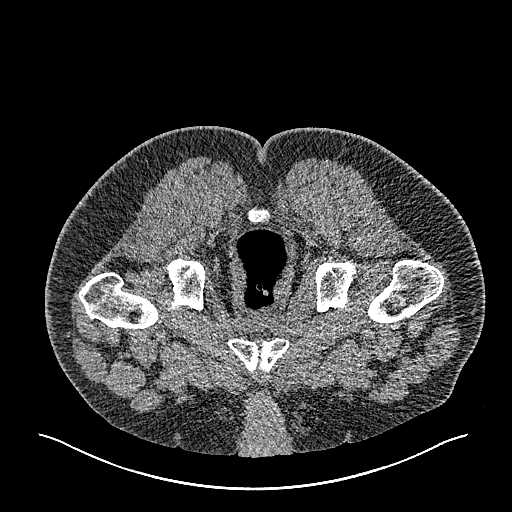
[im 38/373  bone]
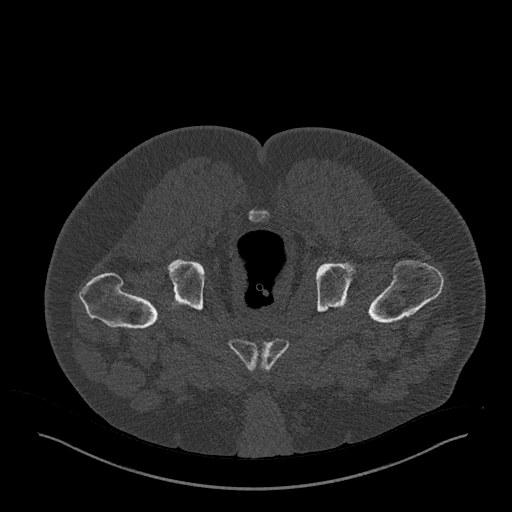
[im 75/373  soft-tissue]
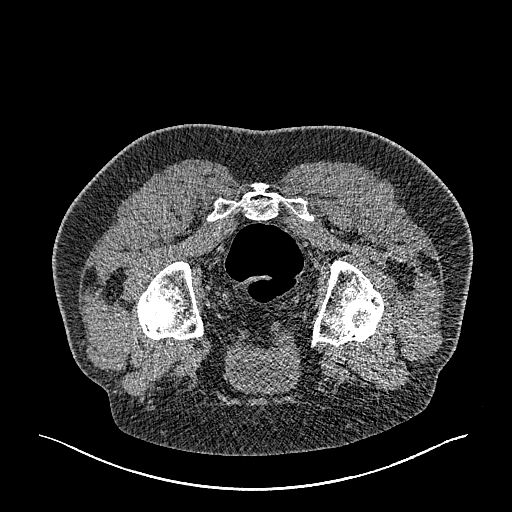
[im 112/373  soft-tissue]
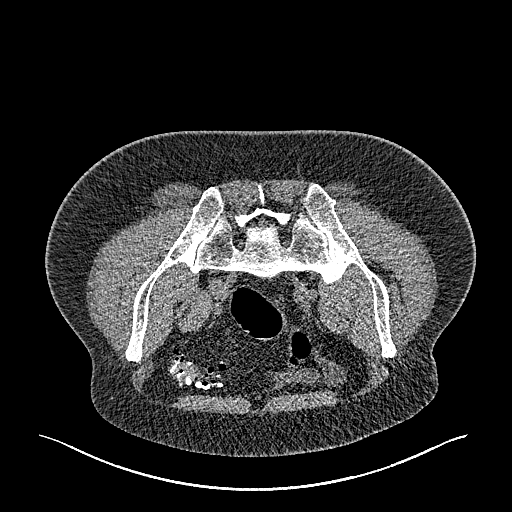
[im 149/373  soft-tissue]
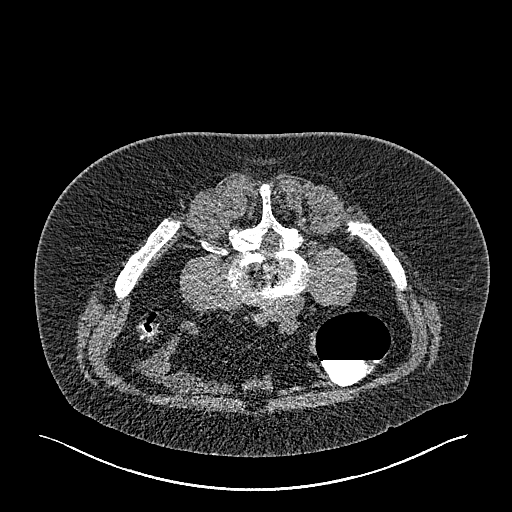
[im 187/373  soft-tissue]
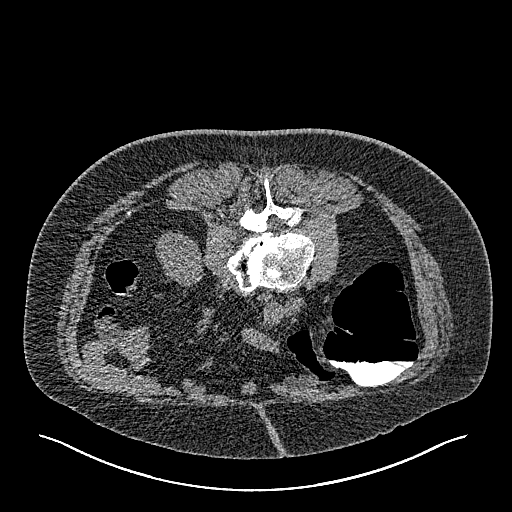
[im 224/373  soft-tissue]
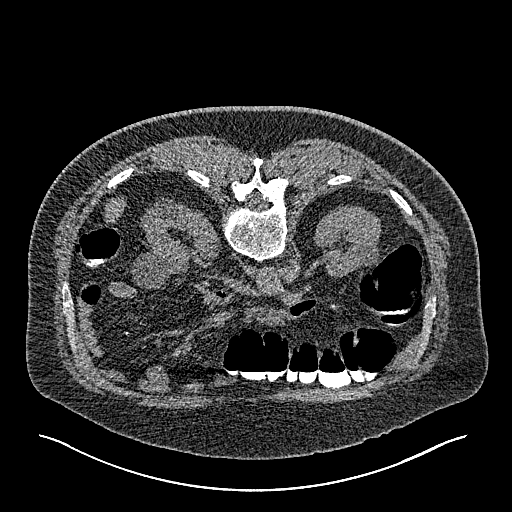
[im 224/373  lung]
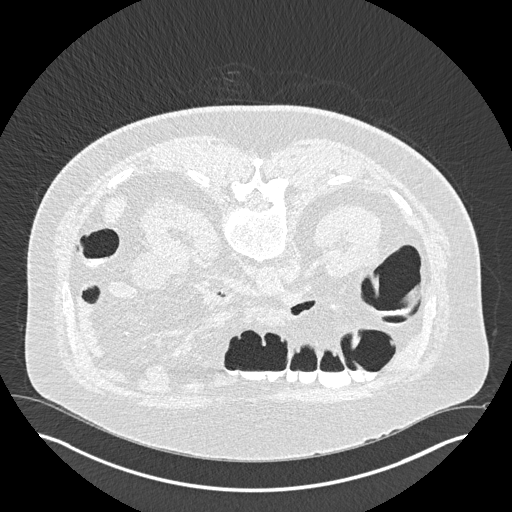
[im 261/373  soft-tissue]
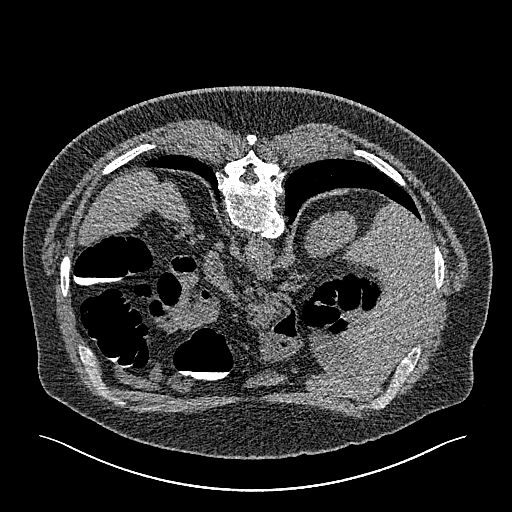
[im 261/373  lung]
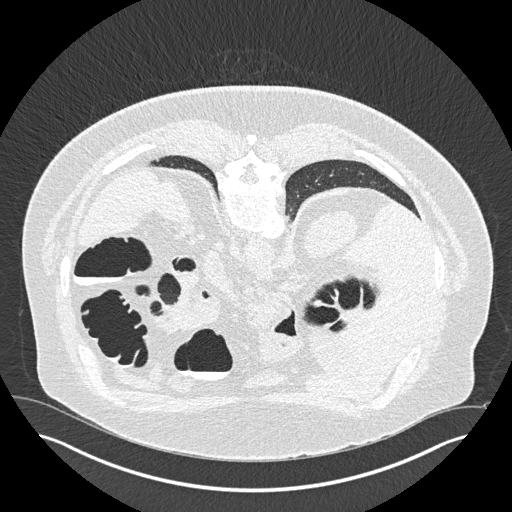
[im 298/373  soft-tissue]
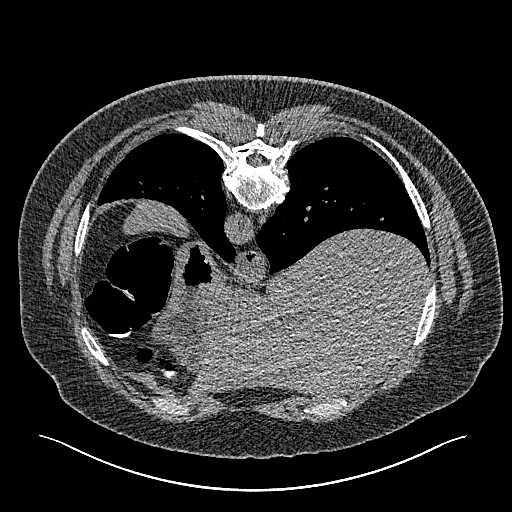
[im 298/373  lung]
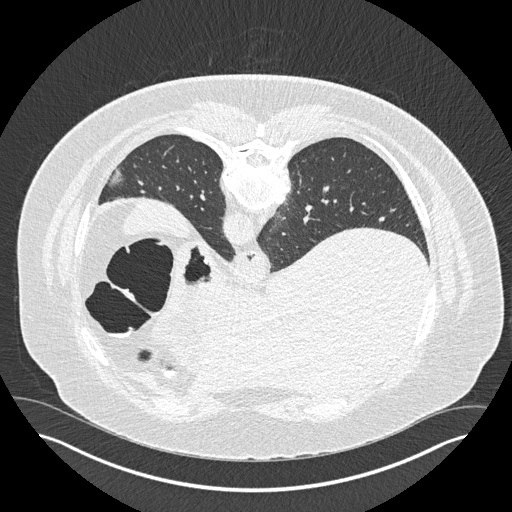
[im 335/373  soft-tissue]
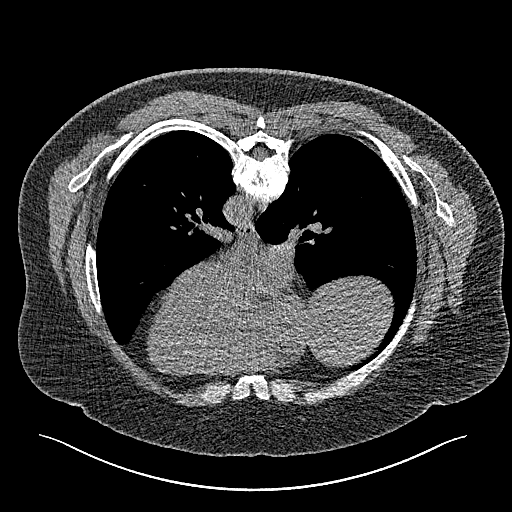
[im 335/373  lung]
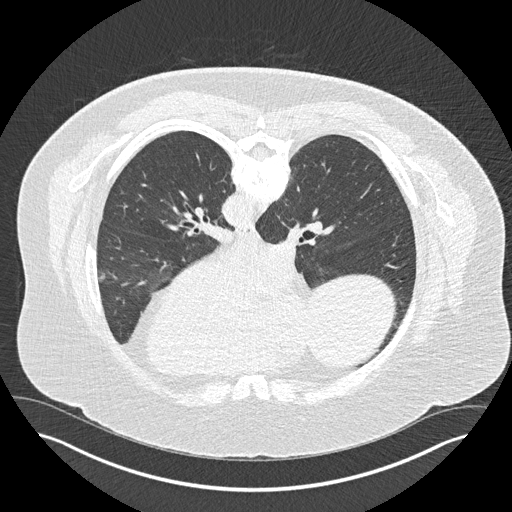
[im 335/373  bone]
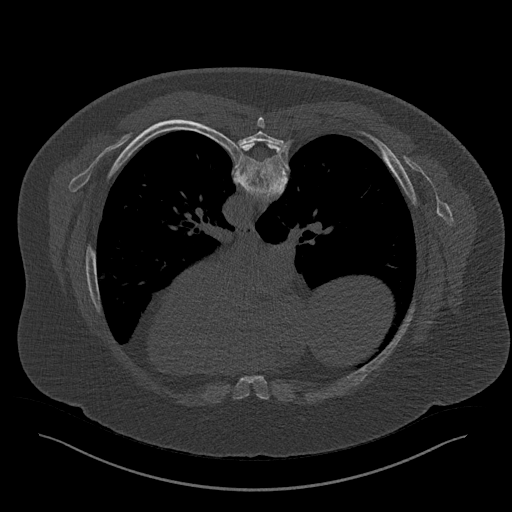

[12 of 46 positions shown; findings below may reference images not displayed]

FINDINGS: VIRTUAL COLONOSCOPY

Mild to moderate layering fluid/contrast, which shifts positions on
supine and prone evaluation.

Underdistention of the sigmoid colon, slightly improved on supine
imaging, although this still constraints endoluminal fly through
evaluation.

Within those constraints, there is no significant colonic polyp,
mass, apple core lesion, or stricture.

Sigmoid diverticulosis, without evidence of diverticulitis.

No evidence of bowel obstruction. Normal appendix (series 4/image
112).

Virtual colonoscopy is not designed to detect diminutive polyps
(i.e., less than or equal to 5 mm), the presence or absence of which
may not affect clinical management.

CT ABDOMEN AND PELVIS WITHOUT CONTRAST

Lower chest: Lung bases are clear.

Hepatobiliary: Unenhanced liver is unremarkable.

Gallbladder is notable for layering sludge and/or noncalcified
gallstones (series 4/image 43), without associated inflammatory
changes. No intrahepatic or extrahepatic ductal dilatation.

Pancreas: Within normal limits.

Spleen: Within normal limits.

Adrenals/Urinary Tract: Adrenal glands are within normal limits.

Bilateral renal cysts, measuring up to 3.5 cm in the anterior left
upper kidney (series 4/image 51). 3 mm nonobstructing left upper
pole renal calculus (series 4/image 46). No hydronephrosis.

Bladder is thick-walled although underdistended.

Stomach/Bowel: Stomach is within normal limits.

Visualized bowel is described above.

Vascular/Lymphatic: No evidence of abdominal aortic aneurysm.

Atherosclerotic calcifications of the abdominal aorta and branch
vessels.

No suspicious abdominopelvic lymphadenopathy.

Reproductive: Prostate is unremarkable.

Other: No abdominopelvic ascites.

Musculoskeletal: Degenerative changes of the visualized
thoracolumbar spine.
IMPRESSION: No significant colonic polyp, mass, apple core lesion, or stricture.

Sigmoid diverticulosis, without evidence of diverticulitis.

Additional ancillary findings as above.

## 2023-02-08 NOTE — Progress Notes (Signed)
His cholesterol is controlled.  Patient is aware of labs

## 2023-02-22 DIAGNOSIS — L820 Inflamed seborrheic keratosis: Secondary | ICD-10-CM | POA: Diagnosis not present

## 2023-02-22 DIAGNOSIS — D225 Melanocytic nevi of trunk: Secondary | ICD-10-CM | POA: Diagnosis not present

## 2023-02-22 DIAGNOSIS — D485 Neoplasm of uncertain behavior of skin: Secondary | ICD-10-CM | POA: Diagnosis not present

## 2023-02-22 DIAGNOSIS — L570 Actinic keratosis: Secondary | ICD-10-CM | POA: Diagnosis not present

## 2023-02-22 DIAGNOSIS — D2239 Melanocytic nevi of other parts of face: Secondary | ICD-10-CM | POA: Diagnosis not present

## 2023-02-25 ENCOUNTER — Other Ambulatory Visit: Payer: Self-pay | Admitting: Internal Medicine

## 2023-02-26 ENCOUNTER — Other Ambulatory Visit: Payer: Self-pay | Admitting: Internal Medicine

## 2023-03-25 ENCOUNTER — Other Ambulatory Visit: Payer: Self-pay

## 2023-03-25 MED ORDER — FLUTICASONE PROPIONATE 50 MCG/ACT NA SUSP: 1.0000 | Freq: Two times a day (BID) | NASAL | 0 refills | Status: AC

## 2023-04-04 ENCOUNTER — Other Ambulatory Visit: Payer: Self-pay | Admitting: Internal Medicine

## 2023-04-04 MED ORDER — LISINOPRIL 40 MG PO TABS: 40.0000 mg | ORAL_TABLET | Freq: Every day | ORAL | 1 refills | Status: DC

## 2023-05-03 ENCOUNTER — Ambulatory Visit: Payer: PPO | Admitting: Internal Medicine

## 2023-05-10 ENCOUNTER — Encounter: Payer: Self-pay | Admitting: Internal Medicine

## 2023-05-10 ENCOUNTER — Ambulatory Visit: Payer: PPO | Admitting: Internal Medicine

## 2023-05-10 VITALS — BP 122/78 | HR 79 | Temp 98.7°F | Resp 18 | Ht 71.0 in | Wt 257.2 lb

## 2023-05-10 DIAGNOSIS — I10 Essential (primary) hypertension: Secondary | ICD-10-CM | POA: Diagnosis not present

## 2023-05-10 DIAGNOSIS — M25511 Pain in right shoulder: Secondary | ICD-10-CM

## 2023-05-10 MED ORDER — DICLOFENAC SODIUM 75 MG PO TBEC: 75.0000 mg | DELAYED_RELEASE_TABLET | Freq: Two times a day (BID) | ORAL | 0 refills | Status: DC

## 2023-05-10 NOTE — Progress Notes (Signed)
Office Visit  Subjective   Patient ID: Justin Bailey   DOB: July 27, 1950   Age: 73 y.o.   MRN: [id number]   Chief Complaint Chief Complaint  Patient presents with   Follow-up     History of Present Illness The patient is a 73 year old Caucasian/White male who presents for a follow-up evaluation of hypertension.   Since his last visit, he has not had any problems.  This past year, his BP was not controlled and we added amlodipine to his regimen.  Since then, his BP has been controlled.  The patient has not been checking his blood pressure at home.  The patient's current medications include: lisinopril 40 mg daily and amlodipine 5mg  daily. The patient has been tolerating his medications well. The patient denies any headache, visual changes, dizziness, lightheadness, chest pain, shortness of breath, orthopnea, weakness/numbness, and edema.  He reports there have been no other symptoms noted.    The patient also has problems with his right shoulder where he started having pain in his right shoulder.  He states that he fell on this shoulder 3 years ago and it took him a while to recover from it.  He states that he only has pain when he tries to lift something but he states that arm/shoulder has no strength.  He has no pain at rest but when he uses his arm, he will have a dull aching pain.  He takes ibuprofen which knocks his pain out but he only takes this prn.  There is no weakness/numbness/tingling of his fingers.       Past Medical History Past Medical History:  Diagnosis Date   Aortic atherosclerosis (HCC)    BPH (benign prostatic hyperplasia)    CAD (coronary artery disease)    Colon polyp    Hypercholesterolemia    Hypertension    Osteoarthritis      Allergies Allergies  Allergen Reactions   Rosuvastatin Rash     Medications  Current Outpatient Medications:    amLODipine (NORVASC) 5 MG tablet, TAKE 1 TABLET(5 MG) BY MOUTH EVERY DAY, Disp: 90 tablet, Rfl: 0   aspirin EC 81 MG  tablet, Take 1 tablet (81 mg total) by mouth daily. Swallow whole., Disp: 90 tablet, Rfl: 3   atorvastatin (LIPITOR) 20 MG tablet, Take 1 tablet (20 mg total) by mouth daily., Disp: 90 tablet, Rfl: 3   Docusate Calcium (STOOL SOFTENER PO), Take 3 tablets by mouth daily., Disp: , Rfl:    fluticasone (FLONASE) 50 MCG/ACT nasal spray, Place 1 spray into both nostrils in the morning and at bedtime., Disp: 15.8 mL, Rfl: 0   lisinopril (ZESTRIL) 40 MG tablet, Take 1 tablet (40 mg total) by mouth daily., Disp: 90 tablet, Rfl: 1   Multiple Vitamins-Minerals (CENTRUM SILVER 50+MEN PO), Take 1 tablet by mouth daily., Disp: , Rfl:    nitroGLYCERIN (NITROSTAT) 0.3 MG SL tablet, Place 1 tablet (0.3 mg total) under the tongue every 5 (five) minutes as needed for chest pain (if pain not gone after 3 doses, call 911)., Disp: 90 tablet, Rfl: 12   sildenafil (VIAGRA) 50 MG tablet, Take 1 tablet (50 mg total) by mouth daily as needed for erectile dysfunction., Disp: 10 tablet, Rfl: 5   Review of Systems Review of Systems  Constitutional:  Negative for chills and fever.  Eyes:  Negative for blurred vision and double vision.  Respiratory:  Negative for cough and shortness of breath.   Cardiovascular:  Negative for chest pain, palpitations  and leg swelling.  Gastrointestinal:  Negative for abdominal pain, constipation, diarrhea, nausea and vomiting.  Musculoskeletal:  Negative for myalgias.       Shoulder pain  Skin:  Negative for itching and rash.  Neurological:  Negative for dizziness, weakness and headaches.       Objective:    Vitals BP 122/78   Pulse 79   Temp 98.7 F (37.1 C)   Resp 18   Ht 5\' 11"  (1.803 m)   Wt 257 lb 3.2 oz (116.7 kg)   SpO2 96%   BMI 35.87 kg/m    Physical Examination Physical Exam Constitutional:      Appearance: Normal appearance. He is not ill-appearing.  Cardiovascular:     Rate and Rhythm: Normal rate and regular rhythm.     Pulses: Normal pulses.     Heart  sounds: No murmur heard.    No friction rub. No gallop.  Pulmonary:     Effort: Pulmonary effort is normal. No respiratory distress.     Breath sounds: No wheezing, rhonchi or rales.  Abdominal:     General: Abdomen is flat. Bowel sounds are normal. There is no distension.     Palpations: Abdomen is soft.     Tenderness: There is no abdominal tenderness.  Musculoskeletal:     Right lower leg: No edema.     Left lower leg: No edema.     Comments: He had normal ROM of his bilateral shoulders.  I could not elicit any pain on my exam with his shoulder.  He had a negative empty bottle sign.  His strength was 5/5 in both upper extremities.  Skin:    General: Skin is warm and dry.     Findings: No rash.  Neurological:     Mental Status: He is alert.        Assessment & Plan:   Essential hypertension His BP is doing well and is controlled.  We will continue his current medications.  Acute pain of right shoulder I am going to place him on diclofenac for 10 days and see if this helps his pain.  If it is not improved, I am going to send him to ortho.    Return in about 3 months (around 08/08/2023) for annual.   Crist Fat, MD

## 2023-05-10 NOTE — Assessment & Plan Note (Signed)
His BP is doing well and is controlled.  We will continue his current medications.

## 2023-05-10 NOTE — Assessment & Plan Note (Signed)
I am going to place him on diclofenac for 10 days and see if this helps his pain.  If it is not improved, I am going to send him to ortho.

## 2023-05-12 ENCOUNTER — Other Ambulatory Visit: Payer: Self-pay | Admitting: Cardiology

## 2023-05-18 ENCOUNTER — Other Ambulatory Visit: Payer: Self-pay | Admitting: Internal Medicine

## 2023-05-25 ENCOUNTER — Other Ambulatory Visit: Payer: Self-pay | Admitting: Internal Medicine

## 2023-05-28 ENCOUNTER — Other Ambulatory Visit: Payer: Self-pay | Admitting: Internal Medicine

## 2023-06-09 ENCOUNTER — Other Ambulatory Visit: Payer: Self-pay | Admitting: Internal Medicine

## 2023-07-01 ENCOUNTER — Other Ambulatory Visit: Payer: Self-pay

## 2023-07-08 ENCOUNTER — Other Ambulatory Visit: Payer: Self-pay

## 2023-07-08 MED ORDER — DICLOFENAC SODIUM 75 MG PO TBEC: 75.0000 mg | DELAYED_RELEASE_TABLET | Freq: Two times a day (BID) | ORAL | 0 refills | Status: DC

## 2023-07-15 ENCOUNTER — Other Ambulatory Visit: Payer: Self-pay | Admitting: Internal Medicine

## 2023-08-08 ENCOUNTER — Encounter: Payer: Self-pay | Admitting: Internal Medicine

## 2023-08-08 ENCOUNTER — Ambulatory Visit: Payer: PPO | Admitting: Internal Medicine

## 2023-08-08 VITALS — BP 126/82 | HR 70 | Temp 98.2°F | Resp 18 | Ht 70.0 in | Wt 257.0 lb

## 2023-08-08 DIAGNOSIS — N400 Enlarged prostate without lower urinary tract symptoms: Secondary | ICD-10-CM | POA: Diagnosis not present

## 2023-08-08 DIAGNOSIS — Z6836 Body mass index (BMI) 36.0-36.9, adult: Secondary | ICD-10-CM | POA: Diagnosis not present

## 2023-08-08 DIAGNOSIS — I10 Essential (primary) hypertension: Secondary | ICD-10-CM | POA: Diagnosis not present

## 2023-08-08 DIAGNOSIS — E7800 Pure hypercholesterolemia, unspecified: Secondary | ICD-10-CM | POA: Diagnosis not present

## 2023-08-08 DIAGNOSIS — I700 Atherosclerosis of aorta: Secondary | ICD-10-CM | POA: Diagnosis not present

## 2023-08-08 DIAGNOSIS — I2510 Atherosclerotic heart disease of native coronary artery without angina pectoris: Secondary | ICD-10-CM | POA: Diagnosis not present

## 2023-08-08 DIAGNOSIS — Z0000 Encounter for general adult medical examination without abnormal findings: Secondary | ICD-10-CM

## 2023-08-08 DIAGNOSIS — M150 Primary generalized (osteo)arthritis: Secondary | ICD-10-CM | POA: Diagnosis not present

## 2023-08-08 MED ORDER — CELECOXIB 200 MG PO CAPS: 200.0000 mg | ORAL_CAPSULE | Freq: Every day | ORAL | 1 refills | Status: DC

## 2023-08-08 MED ORDER — PNEUMOVAX 23 25 MCG/0.5ML IJ SOLN: 0.5000 mL | Freq: Once | INTRAMUSCULAR | 0 refills | Status: AC

## 2023-08-08 NOTE — Assessment & Plan Note (Signed)
 He denies any angina today.  We will continue with medical management.  He is on ACE-I, statin and ASA.

## 2023-08-08 NOTE — Progress Notes (Signed)
 Preventive Screening-Counseling & Management    Justin Bailey is a 73 year old Caucasian/White male who presents for his annual wellness exam. He is due for the following health maintenance studies: eye exam and screening labs. This patient's past medical history Benign Prostatic Hypertrophy, Colon Polyp, and Hypertension.    His last eye exam was in 2022 and he states his vision is doing well. He did have a CT virtual colonoscopy done on 08/15/2020 which showed no significant polyps or masses. He did have diverticulosis and mention of atherosclerotic calcification of abdominal aorta and branch vessels. He had a prior CT virtual colonoscopy on 02/2016 and this showed mild diverticulosis but it was otherwise normal. His last colonoscopy prior to that was done on 09/2010 by Dr. Randal Bury which showed just diverticulosis. He has had a history of colon polyps where Dr. Randal Bury felt that due to the patient's severe angulation of his bowel that he does not recommend colonoscopy but wants him to have CT colography every 5 years. He does have some BPH but denies any problems with urination.  The patient does not smoke.  He does exercise by walking. He does get yearly flu vaccines. He had a prevnar 13 vaccine in 07/2021.  I wrote him for the pneumovax 23 vaccine last year but he never went and done this.  He only had one shingrix vaccine in 07/2021 and never had the 2nd vaccine.  He has had 2 COVID-19 vaccines but no boosters. The patient denies any depression, anxiety or memory loss. The patient is on an ASA 81mg  daily.   The patient is a 73 year old Caucasian/White male who presents for a follow-up evaluation of hypertension.   Since his last visit, he has not had any problems.  This past year, his BP was not controlled and we added amlodipine  to his regimen.  Since then, his BP has been controlled.  The patient has not been checking his blood pressure at home.  The patient's current medications include: lisinopril   40 mg daily and amlodipine  5mg  daily. The patient has been tolerating his medications well. The patient denies any headache, visual changes, dizziness, lightheadness, chest pain, shortness of breath, orthopnea, weakness/numbness, and edema.  He reports there have been no other symptoms noted.     The patient also has problems with his right shoulder where he started having pain in his right shoulder.  He states that he fell on this shoulder 3 years ago and it took him a while to recover from it.  He states that he only has pain when he tries to lift something but he states that arm/shoulder has no strength.  He has no pain at rest but when he uses his arm, he will have a dull aching pain.  He takes ibuprofen which knocks his pain out but he only takes this prn.  There is no weakness/numbness/tingling of his fingers.  Mr. Chandley also underwent a right total knee arthroplasty on 09/28/2020.  He had right knee pain for the last 10-12 years.  He has a history of degenerative arthritis of his knee where he takes NSAIDS with minimal relief.  He had never had an injection of steriods in his knees.  He has completed therapy and has no problems with his knees today.  He also has a history of CAD.  He did see Dr. Sandee Crook in 11/2022 and his CAD was stable.  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  We did an EKG and this showed NSR.   I did refer him to cardiology where he does have CAD with stable angina pectoris.  They performed a cardiac CTA on 05/02/2022 showing a coronary calcium  score which was severely elevated 333/69th percentile with his right coronary artery totally occluded in its mid portion and there was moderate stenosis of the marginal branch.  Cardiology wanted to do medical management and he was started on cardiac rehabilitation at that time.  He continues to exercise at his home.  Today, he denies any further chest  tightness/pressure.  When he exercises, he denies any chest pain/pressure or SOB.  He was placed initially on crestor  but this caused hives.  He was then placed on atorvastatin  but he denies any myalgias or fatigue.    The patient also  returns today for routine followup on his cholesterol. Overall, he states he is doing well and is without any complaints or problems at this time. He specifically denies abdominal pain, nausea, vomiting, diarrhea, myalgias, and fatigue. He remains on dietary management as well as atorvastatin  20mg  qhs. She is fasting in anticipation for labs today.          Are there smokers in your home (other than you)? No  Risk Factors Current exercise habits:  as above   Dietary issues discussed: none   Depression Screen (Note: if answer to either of the following is "Yes", a more complete depression screening is indicated)   Over the past two weeks, have you felt down, depressed or hopeless? No  Over the past two weeks, have you felt little interest or pleasure in doing things? No  Have you lost interest or pleasure in daily life? No  Do you often feel hopeless? No  Do you cry easily over simple problems? No  Activities of Daily Living In your present state of health, do you have any difficulty performing the following activities?:  Driving? No Managing money?  No Feeding yourself? No Getting from bed to chair? No Climbing a flight of stairs? No Preparing food and eating?: No Bathing or showering? No Getting dressed: No Getting to the toilet? No Using the toilet:No Moving around from place to place: No In the past year have you fallen or had a near fall?:No   Are you sexually active?  No  Do you have more than one partner?  No  Hearing Difficulties: No Do you often ask people to speak up or repeat themselves? Yes Do you experience ringing or noises in your ears? No Do you have difficulty understanding soft or whispered voices? No   Do you feel that  you have a problem with memory? No  Do you often misplace items? No  Do you feel safe at home?  Yes  Cognitive Testing  Alert? Yes  Normal Appearance?Yes  Oriented to person? Yes  Place? Yes   Time? Yes  Recall of three objects?  Yes  Can perform simple calculations? Yes  Displays appropriate judgment?Yes  Can read the correct time from a watch face?Yes  HIS MMSE WAS 30/30 TODAY  Fall Risk Prevention  Any stairs in or around the home? Yes  If so, are there any without handrails? Yes  Home free of loose throw rugs in walkways, pet beds, electrical cords, etc? Yes  Adequate lighting in your home to reduce risk of falls? Yes  Use of a cane, walker or w/c? No  Time Up and Go  Was the test performed? Yes .  Length of time to ambulate 10 feet: 5 sec.   Gait steady and fast without use of assistive device    Advanced Directives have been discussed with the patient? Yes   List the Names of Other Physician/Practitioners you currently use: Patient Care Team: Wayne Haines, MD as PCP - General (Internal Medicine)    Past Medical History:  Diagnosis Date   Aortic atherosclerosis (HCC)    BPH (benign prostatic hyperplasia)    CAD (coronary artery disease)    Colon polyp    Hypercholesterolemia    Hypertension    Osteoarthritis     Past Surgical History:  Procedure Laterality Date   TONSILLECTOMY     VASECTOMY        Current Medications  Current Outpatient Medications  Medication Sig Dispense Refill   amLODipine  (NORVASC ) 5 MG tablet TAKE 1 TABLET(5 MG) BY MOUTH EVERY DAY 90 tablet 0   aspirin  EC 81 MG tablet Take 1 tablet (81 mg total) by mouth daily. Swallow whole. 90 tablet 3   atorvastatin  (LIPITOR) 20 MG tablet TAKE 1 TABLET(20 MG) BY MOUTH DAILY 90 tablet 1   Docusate Calcium  (STOOL SOFTENER PO) Take 3 tablets by mouth daily.     fluticasone  (FLONASE ) 50 MCG/ACT nasal spray Place 1 spray into both nostrils in the morning and at bedtime. 15.8 mL 0    lisinopril  (ZESTRIL ) 40 MG tablet Take 1 tablet (40 mg total) by mouth daily. 90 tablet 1   Multiple Vitamins-Minerals (CENTRUM SILVER 50+MEN PO) Take 1 tablet by mouth daily.     nitroGLYCERIN  (NITROSTAT ) 0.3 MG SL tablet Place 1 tablet (0.3 mg total) under the tongue every 5 (five) minutes as needed for chest pain (if pain not gone after 3 doses, call 911). 90 tablet 12   sildenafil  (VIAGRA ) 50 MG tablet Take 1 tablet (50 mg total) by mouth daily as needed for erectile dysfunction. 10 tablet 5   No current facility-administered medications for this visit.    Allergies Rosuvastatin    Social History Social History   Tobacco Use   Smoking status: Never   Smokeless tobacco: Former    Types: Snuff    Quit date: 03/2020  Substance Use Topics   Alcohol  use: Never     Review of Systems Review of Systems  Constitutional:  Negative for chills, fever and malaise/fatigue.  Eyes:  Negative for blurred vision and double vision.  Respiratory:  Negative for cough, hemoptysis, shortness of breath and wheezing.   Cardiovascular:  Negative for chest pain, palpitations and leg swelling.  Gastrointestinal:  Negative for abdominal pain, blood in stool, constipation, diarrhea, heartburn, melena, nausea and vomiting.  Genitourinary:  Negative for frequency and hematuria.  Musculoskeletal:  Negative for myalgias.  Skin:  Negative for itching and rash.  Neurological:  Negative for dizziness, weakness and headaches.  Endo/Heme/Allergies:  Negative for polydipsia.  Psychiatric/Behavioral:  Negative for depression and memory loss. The patient is not nervous/anxious and does not have insomnia.      Physical Exam:      Body mass index is 36.88 kg/m. BP 126/82   Pulse 70   Temp 98.2 F (36.8 C)   Resp 18   Ht 5\' 10"  (1.778 m)   Wt 257 lb (116.6 kg)   SpO2 97%   BMI 36.88 kg/m   Physical Exam Constitutional:      Appearance: Normal appearance. He is not ill-appearing.  HENT:  Head:  Normocephalic and atraumatic.     Right Ear: Tympanic membrane, ear canal and external ear normal.     Left Ear: Tympanic membrane, ear canal and external ear normal.     Nose: Nose normal. No congestion or rhinorrhea.     Mouth/Throat:     Mouth: Mucous membranes are dry.     Pharynx: Oropharynx is clear. No oropharyngeal exudate or posterior oropharyngeal erythema.  Eyes:     General: No scleral icterus.    Conjunctiva/sclera: Conjunctivae normal.     Pupils: Pupils are equal, round, and reactive to light.  Neck:     Vascular: No carotid bruit.  Cardiovascular:     Rate and Rhythm: Normal rate and regular rhythm.     Pulses: Normal pulses.     Heart sounds: No murmur heard.    No friction rub. No gallop.  Pulmonary:     Effort: Pulmonary effort is normal. No respiratory distress.     Breath sounds: No wheezing, rhonchi or rales.  Abdominal:     General: Abdomen is flat. Bowel sounds are normal. There is no distension.     Palpations: Abdomen is soft.     Tenderness: There is no abdominal tenderness.  Musculoskeletal:     Cervical back: Neck supple. No tenderness.     Right lower leg: No edema.     Left lower leg: No edema.  Lymphadenopathy:     Cervical: No cervical adenopathy.  Skin:    General: Skin is warm and dry.     Findings: No rash.  Neurological:     General: No focal deficit present.     Mental Status: He is alert and oriented to person, place, and time.  Psychiatric:        Mood and Affect: Mood normal.        Behavior: Behavior normal.      Assessment:      Essential hypertension  Hypercholesterolemia  Coronary artery disease involving native coronary artery of native heart without angina pectoris  Aortic atherosclerosis (HCC)  Primary osteoarthritis involving multiple joints  Benign prostatic hyperplasia without lower urinary tract symptoms  BMI 36.0-36.9,adult  Morbid obesity (HCC)    Plan:     During the course of the visit the  patient was educated and counseled about appropriate screening and preventive services including:   Pneumococcal vaccine  Influenza vaccine Colorectal cancer screening Advanced directives: as discussed  Diet review for nutrition referral? Yes ____  Not Indicated _X___   Patient Instructions (the written plan) was given to the patient.  Essential hypertension His BP has done better since the addition of amlodipine .  We will continue to follow.  Coronary artery disease involving native coronary artery of native heart without angina pectoris He denies any angina today.  We will continue with medical management.  He is on ACE-I, statin and ASA.  Aortic atherosclerosis (HCC) We will continue control of his BP and his cholesterol.  Primary osteoarthritis involving multiple joints He has probable arthritis of his shoulder.  I am going to place him on a trial of celebrex  for his arthritis.  BPH (benign prostatic hyperplasia) He denies any urinary complaints today.  We will check a PSA.  Morbid obesity (HCC) He has morbid obesity associated with HTN, HCL and CAD.  I want him to exercise more, watch his diet and actively try to lose weight.  Hypercholesterolemia His goal LDL with his history of CAD needs to be <70.  We will  check a FLP today.  BMI 36.0-36.9,adult Plan as above.   Prevention Health maintenance discussed.  He needs a repeat colonoscopy in 2027.  I have written him for pneumovax 23 vaccine again.  We will obtain some yearly labs.   Medicare Attestation I have personally reviewed: The patient's medical and social history Their use of alcohol , tobacco or illicit drugs Their current medications and supplements The patient's functional ability including ADLs,fall risks, home safety risks, cognitive, and hearing and visual impairment Diet and physical activities Evidence for depression or mood disorders  The patient's weight, height, and BMI have been recorded in the  chart.  I have made referrals, counseling, and provided education to the patient based on review of the above and I have provided the patient with a written personalized care plan for preventive services.     Wayne Haines, MD   08/08/2023

## 2023-08-08 NOTE — Assessment & Plan Note (Signed)
 He has morbid obesity associated with HTN, HCL and CAD.  I want him to exercise more, watch his diet and actively try to lose weight.

## 2023-08-08 NOTE — Assessment & Plan Note (Signed)
 His BP has done better since the addition of amlodipine .  We will continue to follow.

## 2023-08-08 NOTE — Assessment & Plan Note (Signed)
 His goal LDL with his history of CAD needs to be <70.  We will check a FLP today.

## 2023-08-08 NOTE — Assessment & Plan Note (Signed)
 Plan as above.

## 2023-08-08 NOTE — Assessment & Plan Note (Signed)
 He denies any urinary complaints today.  We will check a PSA.

## 2023-08-08 NOTE — Assessment & Plan Note (Signed)
 We will continue control of his BP and his cholesterol.

## 2023-08-08 NOTE — Assessment & Plan Note (Signed)
 He has probable arthritis of his shoulder.  I am going to place him on a trial of celebrex  for his arthritis.

## 2023-08-09 LAB — CMP14 + ANION GAP
ALT: 24 IU/L (ref 0–44)
AST: 26 IU/L (ref 0–40)
Albumin: 4.5 g/dL (ref 3.9–4.9)
Alkaline Phosphatase: 114 IU/L (ref 44–121)
Anion Gap: 14 mmol/L (ref 10.0–18.0)
BUN/Creatinine Ratio: 18 (ref 10–24)
BUN: 15 mg/dL (ref 8–27)
Bilirubin Total: 0.6 mg/dL (ref 0.0–1.2)
CO2: 23 mmol/L (ref 20–29)
Calcium: 9.7 mg/dL (ref 8.6–10.2)
Chloride: 103 mmol/L (ref 96–106)
Creatinine, Ser: 0.84 mg/dL (ref 0.76–1.27)
Globulin, Total: 2.4 g/dL (ref 1.5–4.5)
Glucose: 89 mg/dL (ref 70–99)
Potassium: 4.8 mmol/L (ref 3.5–5.2)
Sodium: 140 mmol/L (ref 134–144)
Total Protein: 6.9 g/dL (ref 6.0–8.5)
eGFR: 94 mL/min/{1.73_m2} (ref >59)

## 2023-08-09 LAB — CBC WITH DIFFERENTIAL/PLATELET
Basophils Absolute: 0 10*3/uL (ref 0.0–0.2)
Basos: 0 %
EOS (ABSOLUTE): 0.5 10*3/uL — ABNORMAL HIGH (ref 0.0–0.4)
Eos: 6 %
Hematocrit: 44.4 % (ref 37.5–51.0)
Hemoglobin: 14.7 g/dL (ref 13.0–17.7)
Immature Grans (Abs): 0 10*3/uL (ref 0.0–0.1)
Immature Granulocytes: 0 %
Lymphocytes Absolute: 1.5 10*3/uL (ref 0.7–3.1)
Lymphs: 19 %
MCH: 29.7 pg (ref 26.6–33.0)
MCHC: 33.1 g/dL (ref 31.5–35.7)
MCV: 90 fL (ref 79–97)
Monocytes Absolute: 1 10*3/uL — ABNORMAL HIGH (ref 0.1–0.9)
Monocytes: 12 %
Neutrophils Absolute: 5 10*3/uL (ref 1.4–7.0)
Neutrophils: 63 %
Platelets: 252 10*3/uL (ref 150–450)
RBC: 4.95 x10E6/uL (ref 4.14–5.80)
RDW: 12.4 % (ref 11.6–15.4)
WBC: 8 10*3/uL (ref 3.4–10.8)

## 2023-08-09 LAB — HEMOGLOBIN A1C
Est. average glucose Bld gHb Est-mCnc: 105 mg/dL
Hgb A1c MFr Bld: 5.3 % (ref 4.8–5.6)

## 2023-08-09 LAB — LIPID PANEL
Chol/HDL Ratio: 3 ratio (ref 0.0–5.0)
Cholesterol, Total: 96 mg/dL — ABNORMAL LOW (ref 100–199)
HDL: 32 mg/dL — ABNORMAL LOW (ref >39)
LDL Chol Calc (NIH): 51 mg/dL (ref 0–99)
Triglycerides: 54 mg/dL (ref 0–149)
VLDL Cholesterol Cal: 13 mg/dL (ref 5–40)

## 2023-08-09 LAB — TSH: TSH: 1.28 u[IU]/mL (ref 0.450–4.500)

## 2023-08-09 LAB — PSA: Prostate Specific Ag, Serum: 0.6 ng/mL (ref 0.0–4.0)

## 2023-08-15 NOTE — Progress Notes (Signed)
 His labs look good.  Patient aware of labs

## 2023-08-27 ENCOUNTER — Other Ambulatory Visit: Payer: Self-pay | Admitting: Internal Medicine

## 2023-09-30 ENCOUNTER — Other Ambulatory Visit: Payer: Self-pay | Admitting: Internal Medicine

## 2023-11-06 ENCOUNTER — Ambulatory Visit: Admitting: Internal Medicine

## 2023-11-06 ENCOUNTER — Encounter: Payer: Self-pay | Admitting: Internal Medicine

## 2023-11-06 VITALS — BP 140/88 | HR 68 | Temp 97.8°F | Resp 18 | Ht 71.0 in | Wt 257.8 lb

## 2023-11-06 DIAGNOSIS — I10 Essential (primary) hypertension: Secondary | ICD-10-CM

## 2023-11-06 MED ORDER — AMLODIPINE BESYLATE 5 MG PO TABS: 5.0000 mg | ORAL_TABLET | Freq: Every day | ORAL | 1 refills | Status: DC

## 2023-11-06 MED ORDER — ATORVASTATIN CALCIUM 20 MG PO TABS: 20.0000 mg | ORAL_TABLET | Freq: Every day | ORAL | 1 refills | Status: DC

## 2023-11-06 NOTE — Assessment & Plan Note (Signed)
 He normally takes his BP pills around 6-6:30 in the AM.  He is going to decrease his salt intake and we will see what his BP is running on his next visit.

## 2023-11-06 NOTE — Progress Notes (Signed)
 Office Visit  Subjective   Patient ID: Justin Bailey   DOB: January 05, 1951   Age: 73 y.o.   MRN: [id number]   Chief Complaint Chief Complaint  Patient presents with   Hypertension    Pt in today for a BP follow up. The reports he does not check his BP at home.    Diabetes    Pt in today for a follow up. The patient reports he does not check his BS at home, but does drink 8-10 glasses of water a day. The patient reports he tries to walk at least 30 minutes 3 days a week.      History of Present Illness The patient is a 73 year old Caucasian/White male who presents for a follow-up evaluation of hypertension.  Today, he states he thinks his BP is elevated secondary to his salt intake where he is eating salted nuts.  He is also not walking as much due to the heat.  This past year, his BP was not controlled and we added amlodipine  to his regimen.  The patient has not been checking his blood pressure at home.  The patient's current medications include: lisinopril  40 mg daily and amlodipine  5mg  daily. The patient has been tolerating his medications well. The patient denies any headache, visual changes, dizziness, lightheadness, chest pain, shortness of breath, orthopnea, weakness/numbness, and edema.  He reports there have been no other symptoms noted.     Past Medical History Past Medical History:  Diagnosis Date   Aortic atherosclerosis (HCC)    BPH (benign prostatic hyperplasia)    CAD (coronary artery disease)    Colon polyp    Hypercholesterolemia    Hypertension    Osteoarthritis      Allergies Allergies  Allergen Reactions   Rosuvastatin  Rash     Medications  Current Outpatient Medications:    amLODipine  (NORVASC ) 5 MG tablet, TAKE 1 TABLET(5 MG) BY MOUTH EVERY DAY, Disp: 90 tablet, Rfl: 0   aspirin  EC 81 MG tablet, Take 1 tablet (81 mg total) by mouth daily. Swallow whole., Disp: 90 tablet, Rfl: 3   atorvastatin  (LIPITOR) 20 MG tablet, TAKE 1 TABLET(20 MG) BY MOUTH DAILY,  Disp: 90 tablet, Rfl: 1   celecoxib  (CELEBREX ) 200 MG capsule, Take 1 capsule (200 mg total) by mouth daily., Disp: 90 capsule, Rfl: 1   Docusate Calcium  (STOOL SOFTENER PO), Take 3 tablets by mouth daily., Disp: , Rfl:    fluticasone  (FLONASE ) 50 MCG/ACT nasal spray, Place 1 spray into both nostrils in the morning and at bedtime., Disp: 15.8 mL, Rfl: 0   lisinopril  (ZESTRIL ) 40 MG tablet, TAKE 1 TABLET(40 MG) BY MOUTH DAILY, Disp: 90 tablet, Rfl: 1   Multiple Vitamins-Minerals (CENTRUM SILVER 50+MEN PO), Take 1 tablet by mouth daily., Disp: , Rfl:    nitroGLYCERIN  (NITROSTAT ) 0.3 MG SL tablet, Place 1 tablet (0.3 mg total) under the tongue every 5 (five) minutes as needed for chest pain (if pain not gone after 3 doses, call 911)., Disp: 90 tablet, Rfl: 12   sildenafil  (VIAGRA ) 50 MG tablet, Take 1 tablet (50 mg total) by mouth daily as needed for erectile dysfunction., Disp: 10 tablet, Rfl: 5   Review of Systems Review of Systems  Constitutional:  Negative for chills, fever, malaise/fatigue and weight loss.  Eyes:  Negative for blurred vision and double vision.  Respiratory:  Negative for cough and shortness of breath.   Cardiovascular:  Negative for chest pain, palpitations and leg swelling.  Gastrointestinal:  Negative for abdominal pain, constipation, diarrhea, nausea and vomiting.  Musculoskeletal:  Negative for myalgias.  Skin:  Negative for itching and rash.  Neurological:  Negative for dizziness, weakness and headaches.       Objective:    Vitals BP (!) 140/88   Pulse 68   Temp 97.8 F (36.6 C) (Oral)   Resp 18   Ht 5' 11 (1.803 m)   Wt 257 lb 12.8 oz (116.9 kg)   SpO2 98%   BMI 35.96 kg/m    Physical Examination Physical Exam Constitutional:      Appearance: Normal appearance. He is not ill-appearing.  Cardiovascular:     Rate and Rhythm: Normal rate and regular rhythm.     Pulses: Normal pulses.     Heart sounds: No murmur heard.    No friction rub. No gallop.   Pulmonary:     Effort: Pulmonary effort is normal. No respiratory distress.     Breath sounds: No wheezing, rhonchi or rales.  Abdominal:     General: Abdomen is flat. Bowel sounds are normal. There is no distension.     Palpations: Abdomen is soft.     Tenderness: There is no abdominal tenderness.  Musculoskeletal:     Right lower leg: No edema.     Left lower leg: No edema.  Skin:    General: Skin is warm and dry.     Findings: No rash.  Neurological:     Mental Status: He is alert.        Assessment & Plan:   Essential hypertension He normally takes his BP pills around 6-6:30 in the AM.  He is going to decrease his salt intake and we will see what his BP is running on his next visit.    Return in about 3 months (around 02/06/2024).   Selinda Fleeta Finger, MD

## 2023-11-15 DIAGNOSIS — H524 Presbyopia: Secondary | ICD-10-CM | POA: Diagnosis not present

## 2023-11-15 DIAGNOSIS — H353132 Nonexudative age-related macular degeneration, bilateral, intermediate dry stage: Secondary | ICD-10-CM | POA: Diagnosis not present

## 2023-11-24 ENCOUNTER — Other Ambulatory Visit: Payer: Self-pay | Admitting: Internal Medicine

## 2024-01-06 DIAGNOSIS — M1990 Unspecified osteoarthritis, unspecified site: Secondary | ICD-10-CM | POA: Insufficient documentation

## 2024-01-06 NOTE — Progress Notes (Unsigned)
 Cardiology Office Note:    Date:  01/07/2024   ID:  Justin Bailey, DOB 29-Mar-1951, MRN [id number]  PCP:  Fleeta Valeria Mayo, MD  Cardiologist:  Redell Leiter, MD    Referring MD: Fleeta Valeria Mayo, MD    ASSESSMENT:    1. Coronary artery disease of native artery of native heart with stable angina pectoris   2. Agatston coronary artery calcium  score between 200 and 399   3. Mixed hyperlipidemia   4. Essential hypertension    PLAN:    In order of problems listed above:  From a cardiology perspective Carsten is doing well having no anginal discomfort good medical regimen including aspirin  and his high intensity statin.  I stressed with him good technique for blood pressure determinations leave a list for me in 2 weeks he may require antihypertensive treatment.  This time I do not think he requires an ischemic evaluation Lipids are ideal continue his current lipid-lowering therapy LDL 51 non-HDL cholesterol 64 Check home blood pressure good device good technique 2 weeks bring a list to the office and likely requires antihypertensive therapy   Next appointment: 1 year   Medication Adjustments/Labs and Tests Ordered: Current medicines are reviewed at length with the patient today.  Concerns regarding medicines are outlined above.  Orders Placed This Encounter  Procedures   EKG 12-Lead   No orders of the defined types were placed in this encounter.    History of Present Illness:    Justin Bailey is a 73 y.o. male with a hx of CAD with a severely elevated coronary calcium  score occlusion of the right coronary artery it is midportion and moderate stenosis left circumflex marginal coronary artery advise medical therapy hypertension hyperlipidemia  last seen 12/14/2022.  Compliance with diet, lifestyle and medications: Yes  Overall he is doing well he started an exercise program he is frustrated he has not lost weight but he does not have control of his diet.  We discussed quantity quality  and commitment to his exercise and diet along with GLP-1 agonist He is checking blood pressure at home but he is doing it sporadically without good technique and using a wrist device Educated on blood pressure technique and he will purchase a validated upper arm digital [employer] device Tolerates lipid-lowering therapy he is not having muscle pain or weakness and has had no angina edema shortness of breath palpitation or syncope. Past Medical History:  Diagnosis Date   Aortic atherosclerosis    BMI 36.0-36.9,adult 07/31/2022   BPH (benign prostatic hyperplasia)    CAD (coronary artery disease)    Colon polyp    Hypercholesterolemia    Hypertension    Morbid obesity (HCC) 07/31/2022   Osteoarthritis    Primary osteoarthritis involving multiple joints 07/31/2022    Current Medications: Current Meds  Medication Sig   amLODipine  (NORVASC ) 5 MG tablet TAKE 1 TABLET(5 MG) BY MOUTH EVERY DAY   aspirin  EC 81 MG tablet Take 1 tablet (81 mg total) by mouth daily. Swallow whole.   atorvastatin  (LIPITOR) 20 MG tablet Take 1 tablet (20 mg total) by mouth daily.   celecoxib  (CELEBREX ) 200 MG capsule Take 1 capsule (200 mg total) by mouth daily.   Docusate Calcium  (STOOL SOFTENER PO) Take 3 tablets by mouth daily.   fluticasone  (FLONASE ) 50 MCG/ACT nasal spray Place 1 spray into both nostrils in the morning and at bedtime.   lisinopril  (ZESTRIL ) 40 MG tablet TAKE 1 TABLET(40 MG) BY MOUTH DAILY   Multiple Vitamins-Minerals (CENTRUM  SILVER 50+MEN PO) Take 1 tablet by mouth daily.   nitroGLYCERIN  (NITROSTAT ) 0.3 MG SL tablet Place 1 tablet (0.3 mg total) under the tongue every 5 (five) minutes as needed for chest pain (if pain not gone after 3 doses, call 911).   sildenafil  (VIAGRA ) 50 MG tablet Take 1 tablet (50 mg total) by mouth daily as needed for erectile dysfunction.      EKGs/Labs/Other Studies Reviewed:    The following studies were reviewed today:  Cardiac Studies & Procedures    ______________________________________________________________________________________________          CT SCANS  CT CORONARY MORPH W/CTA COR W/SCORE 05/01/2022  Addendum 05/02/2022  5:33 PM ADDENDUM REPORT: 05/02/2022 17:30  CLINICAL DATA:  CP  EXAM: Cardiac/Coronary  CTA  TECHNIQUE: The patient was scanned on a [employer].  FINDINGS: A 120 kV prospective scan was triggered in the descending thoracic aorta at 111 HU's. Axial non-contrast 3 mm slices were carried out through the heart. The data set was analyzed on a dedicated work station and scored using the Agatson method. Gantry rotation speed was 250 msecs and collimation was .6 mm. No beta blockade and 0.8 mg of sl NTG was given. The 3D data set was reconstructed in 5% intervals of the 67-82 % of the R-R cycle. Diastolic phases were analyzed on a dedicated work station using MPR, MIP and VRT modes. The patient received 80 cc of contrast.  Aorta: Normal size.  No calcifications.  No dissection.  Aortic Valve:  Trileaflet.  No calcifications.  Coronary Arteries:  Normal coronary origin.  Right dominance.  RCA is a large dominant artery that gives rise to PDA and PLA. This artery is completely occluded in its mid portion. Reconstitution of flow is noted distally.  Left main is a large artery that gives rise to LAD and LCX arteries.  LAD is a large vessel that has calcified plaque in its mid portion with minimal stenosis of 0-25%.  LCX is a non-dominant artery that gives rise to one large OM1 branch. I the proximal portion of OM1 branch mixed plaque is noted with moderate stenosis of 50-69%.  Other findings:  Normal pulmonary vein drainage into the left atrium.  Normal left atrial appendage without a thrombus.  Normal size of the pulmonary artery.  IMPRESSION: 1. Coronary calcium  score of 333. This was 49 percentile for age and sex matched control.  2. Normal coronary origin with right  dominance.  3. CAD-RADS 5 Total coronary occlusion (100%) - mid RCA. Consider cardiac catheterization or viability assessment. Consider symptom-guided anti-ischemic pharmacotherapy as well as risk factor modification per guideline directed care.  4. Moderate stenosis of OM1 (50-69%) is noted. Study will be submitted for FFR/   Electronically Signed By: Lamar Fitch M.D. On: 05/02/2022 17:30  Narrative EXAM: OVER-READ INTERPRETATION  CT CHEST  The following report is a limited chest CT over-read performed by radiologist Dr. Jackquline Boxer of [facility] on 05/01/2022. This over-read does not include interpretation of cardiac or coronary anatomy or pathology. The coronary CTA interpretation by the cardiologist is attached.  COMPARISON:  None Available.  FINDINGS: Limited view of the lung parenchyma demonstrates no suspicious nodularity. Airways are normal.  Limited view of the mediastinum demonstrates no adenopathy. Esophagus normal.  Limited view of the upper abdomen unremarkable.  Limited view of the skeleton and chest wall is unremarkable.  IMPRESSION: No significant extracardiac findings.  Electronically Signed: By: Jackquline Boxer M.D. On: 05/01/2022 11:34  ______________________________________________________________________________________________      EKG Interpretation Date/Time:  Tuesday January 07 2024 08:10:18 EDT Ventricular Rate:  67 PR Interval:  256 QRS Duration:  94 QT Interval:  388 QTC Calculation: 409 R Axis:   -16  Text Interpretation: Sinus rhythm with 1st degree A-V block No previous ECGs available Confirmed by Monetta Rogue ([id number]) on 01/07/2024 8:37:52 AM   Recent Labs: 08/08/2023: ALT 24; BUN 15; Creatinine, Ser 0.84; Hemoglobin 14.7; Platelets 252; Potassium 4.8; Sodium 140; TSH 1.280  Recent Lipid Panel    Component Value Date/Time   CHOL 96 (L) 08/08/2023 0920   TRIG 54 08/08/2023 0920   HDL 32 (L)  08/08/2023 0920   CHOLHDL 3.0 08/08/2023 0920   LDLCALC 51 08/08/2023 0920    Physical Exam:    VS:  BP (!) 160/84 (BP Location: Right Arm, Patient Position: Sitting)   Pulse 67   Ht 6' (1.829 m)   Wt 258 lb 9.6 oz (117.3 kg)   SpO2 97%   BMI 35.07 kg/m     Wt Readings from Last 3 Encounters:  01/07/24 258 lb 9.6 oz (117.3 kg)  11/06/23 257 lb 12.8 oz (116.9 kg)  08/08/23 257 lb (116.6 kg)     GEN:  Well nourished, well developed in no acute distress HEENT: Normal NECK: No JVD; No carotid bruits LYMPHATICS: No lymphadenopathy CARDIAC: RRR, no murmurs, rubs, gallops RESPIRATORY:  Clear to auscultation without rales, wheezing or rhonchi  ABDOMEN: Soft, non-tender, non-distended MUSCULOSKELETAL:  No edema; No deformity  SKIN: Warm and dry NEUROLOGIC:  Alert and oriented x 3 PSYCHIATRIC:  Normal affect    Signed, Rogue Monetta, MD  01/07/2024 8:38 AM    [facility]

## 2024-01-07 ENCOUNTER — Ambulatory Visit: Attending: Cardiology | Admitting: Cardiology

## 2024-01-07 ENCOUNTER — Encounter: Payer: Self-pay | Admitting: Cardiology

## 2024-01-07 VITALS — BP 148/80 | HR 67 | Ht 72.0 in | Wt 258.6 lb

## 2024-01-07 DIAGNOSIS — R931 Abnormal findings on diagnostic imaging of heart and coronary circulation: Secondary | ICD-10-CM

## 2024-01-07 DIAGNOSIS — I10 Essential (primary) hypertension: Secondary | ICD-10-CM | POA: Diagnosis not present

## 2024-01-07 DIAGNOSIS — E782 Mixed hyperlipidemia: Secondary | ICD-10-CM

## 2024-01-07 DIAGNOSIS — I25118 Atherosclerotic heart disease of native coronary artery with other forms of angina pectoris: Secondary | ICD-10-CM | POA: Diagnosis not present

## 2024-01-07 NOTE — Patient Instructions (Addendum)
 Medication Instructions:   Your physician recommends that you continue on your current medications as directed. Please refer to the Current Medication list given to you today.   *If you need a refill on your cardiac medications before your next appointment, please call your pharmacy*  Lab Work: None If you have labs (blood work) drawn today and your tests are completely normal, you will receive your results only by: MyChart Message (if you have MyChart) OR A paper copy in the mail If you have any lab test that is abnormal or we need to change your treatment, we will call you to review the results.  Testing/Procedures: None  Follow-Up: At [facility], you and your health needs are our priority.  As part of our continuing mission to provide you with exceptional heart care, our providers are all part of one team.  This team includes your primary Cardiologist (physician) and Advanced Practice Providers or APPs (Physician Assistants and Nurse Practitioners) who all work together to provide you with the care you need, when you need it.  Your next appointment:   1 year(s)  Provider:   Redell Leiter, MD    We recommend signing up for the patient portal called MyChart.  Sign up information is provided on this After Visit Summary.  MyChart is used to connect with patients for Virtual Visits (Telemedicine).  Patients are able to view lab/test results, encounter notes, upcoming appointments, etc.  Non-urgent messages can be sent to your provider as well.   To learn more about what you can do with MyChart, go to [email or web address].   Other Instructions Check blood pressures at home for 2 weeks using a Omron blood pressure cuff and send in results in 2 weeks.  Please keep a BP log for 2 weeks and send by MyChart or mail.                          Name and DOB__________________________ Dr. Leiter [address] [place], [place] [address]  Blood Pressure Record Sheet To take your  blood pressure, you will need a blood pressure machine. You can buy a blood pressure machine (blood pressure monitor) at your clinic, drug store, or online. When choosing one, consider: An automatic monitor that has an arm cuff. A cuff that wraps snugly around your upper arm. You should be able to fit only one finger between your arm and the cuff. A device that stores blood pressure reading results. Do not choose a monitor that measures your blood pressure from your wrist or finger. Follow your health care provider's instructions for how to take your blood pressure. To use this form: Get one reading in the morning (a.m.) 1-2 hours after you take any medicines. Get one reading in the evening (p.m.) before supper.   Blood pressure log Date: _______________________  a.m. _____________________(1st reading) HR___________            p.m. _____________________(2nd reading) HR__________  Date: _______________________  a.m. _____________________(1st reading) HR___________            p.m. _____________________(2nd reading) HR__________  Date: _______________________  a.m. _____________________(1st reading) HR___________            p.m. _____________________(2nd reading) HR__________  Date: _______________________  a.m. _____________________(1st reading) HR___________            p.m. _____________________(2nd reading) HR__________  Date: _______________________  a.m. _____________________(1st reading) HR___________            p.m. _____________________(2nd  reading) HR__________  Date: _______________________  a.m. _____________________(1st reading) HR___________            p.m. _____________________(2nd reading) HR__________  Date: _______________________  a.m. _____________________(1st reading) HR___________            p.m. _____________________(2nd reading) HR__________   This information is not intended to replace advice given to you by your health care provider. Make  sure you discuss any questions you have with your health care provider. Document Revised: 07/22/2019 Document Reviewed: 07/22/2019 Elsevier Patient Education  2021 [employer].

## 2024-02-04 ENCOUNTER — Other Ambulatory Visit: Payer: Self-pay | Admitting: Internal Medicine

## 2024-02-06 ENCOUNTER — Ambulatory Visit: Admitting: Internal Medicine

## 2024-02-06 ENCOUNTER — Encounter: Payer: Self-pay | Admitting: Internal Medicine

## 2024-02-06 VITALS — BP 148/80 | HR 67 | Temp 97.1°F | Resp 18 | Ht 71.0 in | Wt 262.4 lb

## 2024-02-06 DIAGNOSIS — I10 Essential (primary) hypertension: Secondary | ICD-10-CM | POA: Insufficient documentation

## 2024-02-06 DIAGNOSIS — Z23 Encounter for immunization: Secondary | ICD-10-CM | POA: Diagnosis not present

## 2024-02-06 DIAGNOSIS — I2510 Atherosclerotic heart disease of native coronary artery without angina pectoris: Secondary | ICD-10-CM | POA: Diagnosis not present

## 2024-02-06 DIAGNOSIS — E7800 Pure hypercholesterolemia, unspecified: Secondary | ICD-10-CM

## 2024-02-06 NOTE — Assessment & Plan Note (Signed)
 He is not fasting today but we will check a FLP on him at this time.

## 2024-02-06 NOTE — Progress Notes (Signed)
 Office Visit  Subjective   Patient ID: Justin Bailey   DOB: 08-19-1950   Age: 73 y.o.   MRN: [id number]   Chief Complaint Chief Complaint  Patient presents with   Follow-up    3 Month follow up     History of Present Illness The patient is a 73 year old Caucasian/White male who presents for a follow-up evaluation of hypertension.   Since his last visit, he has not had any problems.  He saw Dr. Monetta last month and was asked to start checking his BP at home and contact Dr. Monetta with his numbers.  The patient has not been doing this.   This past year, his BP was not controlled and we added amlodipine  to his regimen. The patient has not been checking his blood pressure at home.  The patient's current medications include: lisinopril  40 mg daily and amlodipine  5mg  daily. The patient has been tolerating his medications well. The patient denies any headache, visual changes, dizziness, lightheadness, chest pain, shortness of breath, orthopnea, weakness/numbness, and edema.  He reports there have been no other symptoms noted.     The patient also returns for followup of his CAD.  He saw Dr. Monetta on 01/07/2024 where they felt he had no angina and recommended continued medical management.  Over the interim, the patient is going to the gym 5 days a week and doing cardio.   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  We did an EKG and this showed NSR.   I did refer him to cardiology where he does have CAD with stable angina pectoris.  They performed a cardiac CTA on 05/02/2022 showing a coronary calcium  score which was severely elevated 333/69th percentile with his right coronary artery totally occluded in its mid portion and there was moderate stenosis of the marginal branch.  Cardiology wanted to do medical management and he was started on cardiac rehabilitation at that time.  Today, he denies any further chest  tightness/pressure.  When he exercises, he denies any chest pain/pressure or SOB.  He was placed initially on crestor  but this caused hives.  He was then placed on atorvastatin  but he denies any myalgias or fatigue.    The patient also  returns today for routine followup on his cholesterol. Overall, he states he is doing well and is without any complaints or problems at this time. He specifically denies abdominal pain, nausea, vomiting, diarrhea, myalgias, and fatigue. He remains on dietary management as well as atorvastatin  20mg  qhs. She is fasting in anticipation for labs today.      Past Medical History Past Medical History:  Diagnosis Date   Aortic atherosclerosis    BMI 36.0-36.9,adult 07/31/2022   BPH (benign prostatic hyperplasia)    CAD (coronary artery disease)    Colon polyp    Hypercholesterolemia    Hypertension    Morbid obesity (HCC) 07/31/2022   Osteoarthritis    Primary osteoarthritis involving multiple joints 07/31/2022     Allergies Allergies  Allergen Reactions   Rosuvastatin  Rash     Medications  Current Outpatient Medications:    amLODipine  (NORVASC ) 5 MG tablet, TAKE 1 TABLET(5 MG) BY MOUTH EVERY DAY, Disp: 90 tablet, Rfl: 1   aspirin  EC 81 MG tablet, Take 1 tablet (81 mg total) by mouth daily. Swallow whole., Disp: 90 tablet, Rfl: 3   atorvastatin  (LIPITOR) 20 MG tablet, Take 1 tablet (  20 mg total) by mouth daily., Disp: 90 tablet, Rfl: 1   celecoxib  (CELEBREX ) 200 MG capsule, TAKE 1 CAPSULE(200 MG) BY MOUTH DAILY, Disp: 90 capsule, Rfl: 1   Docusate Calcium  (STOOL SOFTENER PO), Take 3 tablets by mouth daily., Disp: , Rfl:    fluticasone  (FLONASE ) 50 MCG/ACT nasal spray, Place 1 spray into both nostrils in the morning and at bedtime., Disp: 15.8 mL, Rfl: 0   lisinopril  (ZESTRIL ) 40 MG tablet, TAKE 1 TABLET(40 MG) BY MOUTH DAILY, Disp: 90 tablet, Rfl: 1   Multiple Vitamins-Minerals (CENTRUM SILVER 50+MEN PO), Take 1 tablet by mouth daily., Disp: , Rfl:     nitroGLYCERIN  (NITROSTAT ) 0.3 MG SL tablet, Place 1 tablet (0.3 mg total) under the tongue every 5 (five) minutes as needed for chest pain (if pain not gone after 3 doses, call 911)., Disp: 90 tablet, Rfl: 12   sildenafil  (VIAGRA ) 50 MG tablet, Take 1 tablet (50 mg total) by mouth daily as needed for erectile dysfunction., Disp: 10 tablet, Rfl: 5   Review of Systems Review of Systems  Constitutional:  Negative for chills, fever, malaise/fatigue and weight loss.  Eyes:  Negative for blurred vision and double vision.  Respiratory:  Negative for cough and shortness of breath.   Cardiovascular:  Negative for chest pain, palpitations and leg swelling.  Gastrointestinal:  Negative for abdominal pain, constipation, diarrhea, heartburn, nausea and vomiting.  Genitourinary:  Negative for frequency.  Musculoskeletal:  Negative for myalgias.  Skin:  Negative for itching and rash.  Neurological:  Negative for dizziness, weakness and headaches.  Endo/Heme/Allergies:  Negative for polydipsia.       Objective:    Vitals BP (!) 148/80   Pulse 67   Temp (!) 97.1 F (36.2 C) (Temporal)   Resp 18   Ht 5' 11 (1.803 m)   Wt 262 lb 6.4 oz (119 kg)   SpO2 96%   BMI 36.60 kg/m    Physical Examination Physical Exam Constitutional:      Appearance: Normal appearance. He is not ill-appearing.  Cardiovascular:     Rate and Rhythm: Normal rate and regular rhythm.     Pulses: Normal pulses.     Heart sounds: No murmur heard.    No friction rub. No gallop.  Pulmonary:     Effort: Pulmonary effort is normal. No respiratory distress.     Breath sounds: No wheezing, rhonchi or rales.  Abdominal:     General: Abdomen is flat. Bowel sounds are normal. There is no distension.     Palpations: Abdomen is soft.     Tenderness: There is no abdominal tenderness.  Musculoskeletal:     Right lower leg: No edema.     Left lower leg: No edema.  Skin:    General: Skin is warm and dry.     Findings: No rash.   Neurological:     General: No focal deficit present.     Mental Status: He is alert and oriented to person, place, and time.  Psychiatric:        Mood and Affect: Mood normal.        Behavior: Behavior normal.        Assessment & Plan:   Essential hypertension His BP is not controlled.  We will increase his amlodipine  from 5mg  to 10mg  daily and continue on lisinopril  at 40mg  daily.  CAD (coronary artery disease) His CAD is stable and he denies any angina.  We will cotninue on his statin and ASA  with medical therapy.  He will continue to work out.  Hypercholesterolemia He is not fasting today but we will check a FLP on him at this time.    Return in about 3 months (around 05/08/2024).   Selinda Fleeta Finger, MD

## 2024-02-06 NOTE — Addendum Note (Signed)
 Addended by: VAN EYK, Ellwood Steidle on: 02/06/2024 09:20 AM   Modules accepted: Orders

## 2024-02-06 NOTE — Assessment & Plan Note (Signed)
 His BP is not controlled.  We will increase his amlodipine  from 5mg  to 10mg  daily and continue on lisinopril  at 40mg  daily.

## 2024-02-06 NOTE — Assessment & Plan Note (Signed)
 His CAD is stable and he denies any angina.  We will cotninue on his statin and ASA with medical therapy.  He will continue to work out.

## 2024-02-06 NOTE — Addendum Note (Signed)
 Addended by: LENETTA LACKS on: 02/06/2024 09:24 AM   Modules accepted: Orders

## 2024-02-07 LAB — LIPID PANEL
Chol/HDL Ratio: 3.1 ratio (ref 0.0–5.0)
Cholesterol, Total: 108 mg/dL (ref 100–199)
HDL: 35 mg/dL — ABNORMAL LOW (ref >39)
LDL Chol Calc (NIH): 57 mg/dL (ref 0–99)
Triglycerides: 81 mg/dL (ref 0–149)
VLDL Cholesterol Cal: 16 mg/dL (ref 5–40)

## 2024-02-10 ENCOUNTER — Other Ambulatory Visit: Payer: Self-pay

## 2024-02-10 MED ORDER — AMLODIPINE BESYLATE 10 MG PO TABS: 10.0000 mg | ORAL_TABLET | Freq: Every day | ORAL | 2 refills | Status: AC

## 2024-02-10 NOTE — Progress Notes (Signed)
 Rx change per Dr. Fleeta Hamburg last note sent to [facility] in [place]

## 2024-02-28 DIAGNOSIS — L820 Inflamed seborrheic keratosis: Secondary | ICD-10-CM | POA: Diagnosis not present

## 2024-02-28 DIAGNOSIS — L570 Actinic keratosis: Secondary | ICD-10-CM | POA: Diagnosis not present

## 2024-02-28 DIAGNOSIS — L578 Other skin changes due to chronic exposure to nonionizing radiation: Secondary | ICD-10-CM | POA: Diagnosis not present

## 2024-02-28 DIAGNOSIS — D2239 Melanocytic nevi of other parts of face: Secondary | ICD-10-CM | POA: Diagnosis not present

## 2024-02-28 DIAGNOSIS — D225 Melanocytic nevi of trunk: Secondary | ICD-10-CM | POA: Diagnosis not present

## 2024-03-10 ENCOUNTER — Ambulatory Visit: Payer: Self-pay

## 2024-03-10 NOTE — Progress Notes (Signed)
 Patient called.  Unable to reach patient.  Left voicemails, mailing letter   Per VE Tell him his labs look good,

## 2024-04-06 ENCOUNTER — Other Ambulatory Visit: Payer: Self-pay | Admitting: Internal Medicine

## 2024-05-07 ENCOUNTER — Ambulatory Visit: Admitting: Internal Medicine

## 2024-05-07 ENCOUNTER — Encounter: Payer: Self-pay | Admitting: Internal Medicine

## 2024-05-07 VITALS — BP 130/70 | HR 72 | Temp 97.9°F | Resp 18 | Ht 71.0 in | Wt 266.5 lb

## 2024-05-07 DIAGNOSIS — I10 Essential (primary) hypertension: Secondary | ICD-10-CM

## 2024-05-07 DIAGNOSIS — I25118 Atherosclerotic heart disease of native coronary artery with other forms of angina pectoris: Secondary | ICD-10-CM | POA: Diagnosis not present

## 2024-05-07 DIAGNOSIS — E7800 Pure hypercholesterolemia, unspecified: Secondary | ICD-10-CM | POA: Diagnosis not present

## 2024-05-07 MED ORDER — OZEMPIC (0.25 OR 0.5 MG/DOSE) 2 MG/1.5ML ~~LOC~~ SOPN: 0.2500 mg | PEN_INJECTOR | SUBCUTANEOUS | 6 refills | Status: AC

## 2024-05-07 NOTE — Progress Notes (Signed)
 "  Office Visit  Subjective   Patient ID: Justin Bailey   DOB: [birth year]   Age: 74 y.o.   MRN: [id number]   Chief Complaint Chief Complaint  Patient presents with   Follow-up    3 month     History of Present Illness   74 years old male with history of hypertension, hyperlipidemia and coronary artery disease is here for follow-up.  He denies any chest pain.  He has a coronary artery disease with elevated calcium  score.  He takes atorvastatin  20 mg daily and he has LDL was 51 on August 08, 2023.  He denies any side effects.  He denies having any chest pain.  He follows with cardiologist Dr. Monetta every 6 months.  I have reviewed their notes.  He says that he watch his diet and stay very active and has join gym but he is unable to lose weight.  He has gained 4 lb since last visit.  His weight is 266 lb with BMI of 37.  He wanted to try GLP 1 and see if his insurance will cover.  I will send Ozempic  0.25 mg weekly for him.    He also has hypertension and he was told that his blood pressure need to be controlled.  His blood pressure is 1 30/70 today.  He takes amlodipine  10 mg daily and lisinopril  40 mg daily.  He occasionally checks his blood pressure at home.  Past Medical History Past Medical History:  Diagnosis Date   Aortic atherosclerosis    BMI 36.0-36.9,adult 07/31/2022   BPH (benign prostatic hyperplasia)    CAD (coronary artery disease)    Colon polyp    Hypercholesterolemia    Hypertension    Morbid obesity (HCC) 07/31/2022   Osteoarthritis    Primary osteoarthritis involving multiple joints 07/31/2022     Allergies Allergies[1]   Review of Systems Review of Systems  Constitutional: Negative.   HENT: Negative.    Respiratory: Negative.    Cardiovascular: Negative.   Gastrointestinal: Negative.   Neurological: Negative.        Objective:    Vitals BP 130/70   Pulse 72   Temp 97.9 F (36.6 C)   Resp 18   Ht 5' 11 (1.803 m)   Wt 266 lb 8 oz (120.9 kg)    SpO2 97%   BMI 37.17 kg/m    Physical Examination Physical Exam Constitutional:      Appearance: Normal appearance. He is obese.  Eyes:     Extraocular Movements: Extraocular movements intact.     Pupils: Pupils are equal, round, and reactive to light.  Cardiovascular:     Rate and Rhythm: Normal rate and regular rhythm.     Heart sounds: Normal heart sounds.  Pulmonary:     Breath sounds: Normal breath sounds.  Abdominal:     General: Bowel sounds are normal.     Palpations: Abdomen is soft.  Neurological:     General: No focal deficit present.     Mental Status: He is alert and oriented to person, place, and time.        Assessment & Plan:   Essential hypertension   His blood pressure is controlled.  CAD (coronary artery disease)   He has coronary artery disease with high calcium  score.  His LDL was target control.  He will continue to follow with cardiologist every 6 months.  Hypercholesterolemia   He takes atorvastatin  20 mg daily and his LDL is target controlled.  Will repeat lipid panel on next visit.  Morbid obesity (HCC)   He says that in spite of doing exercise, he  is unable to lose weight.  I have discussed with him to decrease portion of his meal and add more vegetables in his diet.   I will start him on semaglutide  0.25 mg weekly to see if insurance covers.    Return in about 3 months (around 08/05/2024).   Roetta Dare, MD      [1]  Allergies Allergen Reactions   Rosuvastatin  Rash   "

## 2024-05-07 NOTE — Assessment & Plan Note (Signed)
"    He has coronary artery disease with high calcium  score.  His LDL was target control.  He will continue to follow with cardiologist every 6 months. "

## 2024-05-07 NOTE — Assessment & Plan Note (Signed)
"    He takes atorvastatin  20 mg daily and his LDL is target controlled.  Will repeat lipid panel on next visit. "

## 2024-05-07 NOTE — Assessment & Plan Note (Signed)
"    He says that in spite of doing exercise, he  is unable to lose weight.  I have discussed with him to decrease portion of his meal and add more vegetables in his diet.   I will start him on semaglutide  0.25 mg weekly to see if insurance covers. "

## 2024-05-07 NOTE — Assessment & Plan Note (Signed)
 His blood pressure is controlled.

## 2024-05-08 ENCOUNTER — Encounter: Payer: Self-pay | Admitting: *Deleted

## 2024-05-08 NOTE — Progress Notes (Signed)
 Justin Bailey                                          MRN: [id number]   05/08/2024   The [facility] Quality Team Specialist reviewed this patient medical record for the purposes of chart review for care gap closure. The following were reviewed: chart review for care gap closure-controlling blood pressure.    [facility] Quality Team

## 2024-05-15 ENCOUNTER — Other Ambulatory Visit: Payer: Self-pay | Admitting: Internal Medicine

## 2024-05-15 MED ORDER — ATORVASTATIN CALCIUM 20 MG PO TABS: 20.0000 mg | ORAL_TABLET | Freq: Every day | ORAL | 1 refills | Status: AC

## 2024-07-30 ENCOUNTER — Ambulatory Visit: Admitting: Internal Medicine
